# Patient Record
Sex: Female | Born: 1950
Health system: Southern US, Community
[De-identification: ages and names within clinical notes are randomized; demographics above are authoritative.]

## PROBLEM LIST (undated history)

## (undated) DIAGNOSIS — B019 Varicella without complication: Secondary | ICD-10-CM

## (undated) DIAGNOSIS — E785 Hyperlipidemia, unspecified: Secondary | ICD-10-CM

## (undated) DIAGNOSIS — Z860100 Personal history of colon polyps, unspecified: Secondary | ICD-10-CM

## (undated) DIAGNOSIS — I10 Essential (primary) hypertension: Secondary | ICD-10-CM

## (undated) HISTORY — PX: TUBAL LIGATION: SHX77

## (undated) HISTORY — PX: TOOTH EXTRACTION: SUR596

## (undated) HISTORY — DX: Essential (primary) hypertension: I10

## (undated) HISTORY — DX: Personal history of colonic polyps: Z86.010

## (undated) HISTORY — DX: Varicella without complication: B01.9

## (undated) HISTORY — DX: Hyperlipidemia, unspecified: E78.5

## (undated) HISTORY — DX: Personal history of colon polyps, unspecified: Z86.0100

---

## 2000-02-20 ENCOUNTER — Other Ambulatory Visit: Admission: RE | Admit: 2000-02-20 | Discharge: 2000-02-20 | Payer: Self-pay | Admitting: Gynecology

## 2002-06-30 ENCOUNTER — Other Ambulatory Visit: Admission: RE | Admit: 2002-06-30 | Discharge: 2002-06-30 | Payer: Self-pay | Admitting: Gynecology

## 2004-05-06 ENCOUNTER — Other Ambulatory Visit: Admission: RE | Admit: 2004-05-06 | Discharge: 2004-05-06 | Payer: Self-pay | Admitting: Gynecology

## 2014-09-14 ENCOUNTER — Other Ambulatory Visit: Payer: Self-pay | Admitting: *Deleted

## 2014-09-14 DIAGNOSIS — IMO0002 Reserved for concepts with insufficient information to code with codable children: Secondary | ICD-10-CM

## 2014-09-14 DIAGNOSIS — R229 Localized swelling, mass and lump, unspecified: Principal | ICD-10-CM

## 2014-09-22 ENCOUNTER — Ambulatory Visit
Admission: RE | Admit: 2014-09-22 | Discharge: 2014-09-22 | Disposition: A | Discharge status: Home / Self Care | Payer: BLUE CROSS/BLUE SHIELD | Source: Ambulatory Visit | Attending: *Deleted | Admitting: *Deleted

## 2014-09-22 DIAGNOSIS — IMO0002 Reserved for concepts with insufficient information to code with codable children: Secondary | ICD-10-CM

## 2014-09-22 DIAGNOSIS — R229 Localized swelling, mass and lump, unspecified: Principal | ICD-10-CM

## 2014-09-22 MED ORDER — IOPAMIDOL (ISOVUE-300) INJECTION 61%
75.0000 mL | Freq: Once | INTRAVENOUS | Status: AC | PRN
  Administered 2014-09-22: 75 mL via INTRAVENOUS

## 2017-08-12 ENCOUNTER — Encounter: Payer: Self-pay | Admitting: Gastroenterology

## 2017-10-15 ENCOUNTER — Ambulatory Visit (AMBULATORY_SURGERY_CENTER): Payer: Self-pay

## 2017-10-15 ENCOUNTER — Other Ambulatory Visit: Payer: Self-pay

## 2017-10-15 VITALS — Ht 67.0 in | Wt 152.8 lb

## 2017-10-15 DIAGNOSIS — Z1211 Encounter for screening for malignant neoplasm of colon: Secondary | ICD-10-CM

## 2017-10-15 MED ORDER — PEG-KCL-NACL-NASULF-NA ASC-C 140 G PO SOLR: 1.0000 | Freq: Once | ORAL | 0 refills | Status: AC

## 2017-10-15 NOTE — Progress Notes (Signed)
Denies allergies to eggs or soy products. Denies complication of anesthesia or sedation. Denies use of weight loss medication. Denies use of O2.   Emmi instructions declined.  

## 2017-10-19 ENCOUNTER — Encounter: Payer: Self-pay | Admitting: Gastroenterology

## 2017-10-29 ENCOUNTER — Ambulatory Visit (AMBULATORY_SURGERY_CENTER): Payer: BLUE CROSS/BLUE SHIELD | Admitting: Gastroenterology

## 2017-10-29 ENCOUNTER — Telehealth: Payer: Self-pay | Admitting: Gastroenterology

## 2017-10-29 ENCOUNTER — Encounter: Payer: Self-pay | Admitting: Gastroenterology

## 2017-10-29 VITALS — BP 119/55 | HR 60 | Temp 98.0°F | Resp 19 | Ht 67.0 in | Wt 152.0 lb

## 2017-10-29 DIAGNOSIS — K6389 Other specified diseases of intestine: Secondary | ICD-10-CM | POA: Diagnosis not present

## 2017-10-29 DIAGNOSIS — D123 Benign neoplasm of transverse colon: Secondary | ICD-10-CM

## 2017-10-29 DIAGNOSIS — Z1211 Encounter for screening for malignant neoplasm of colon: Secondary | ICD-10-CM

## 2017-10-29 DIAGNOSIS — R195 Other fecal abnormalities: Secondary | ICD-10-CM | POA: Diagnosis not present

## 2017-10-29 MED ORDER — SODIUM CHLORIDE 0.9 % IV SOLN: 500.0000 mL | Freq: Once | INTRAVENOUS | Status: DC

## 2017-10-29 NOTE — Op Note (Signed)
[facility] Patient Name: Carmen Cruz Procedure Date: 10/29/2017 9:53 AM MRN: [id number] Endoscopist: Mallie Mussel L. Loletha Carrow , MD Age: 67 Referring MD:  Date of Birth: 1950-05-14 Gender: Female Account #: [id number] Procedure:                Colonoscopy Indications:              Heme positive stool Medicines:                Monitored Anesthesia Care Procedure:                Pre-Anesthesia Assessment:                           - Prior to the procedure, a History and Physical                            was performed, and patient medications and                            allergies were reviewed. The patient's tolerance of                            previous anesthesia was also reviewed. The risks                            and benefits of the procedure and the sedation                            options and risks were discussed with the patient.                            All questions were answered, and informed consent                            was obtained. Prior Anticoagulants: The patient has                            taken no previous anticoagulant or antiplatelet                            agents. ASA Grade Assessment: II - A patient with                            mild systemic disease. After reviewing the risks                            and benefits, the patient was deemed in                            satisfactory condition to undergo the procedure.                           After obtaining informed consent, the colonoscope  was passed under direct vision. Throughout the                            procedure, the patient's blood pressure, pulse, and                            oxygen saturations were monitored continuously. The                            Colonoscope was introduced through the anus and                            advanced to the the cecum, identified by                            appendiceal orifice and ileocecal valve. The                         colonoscopy was performed without difficulty. The                            patient tolerated the procedure well. The quality                            of the bowel preparation was good. The ileocecal                            valve, appendiceal orifice, and rectum were                            photographed. The quality of the bowel preparation                            was evaluated using the BBPS [facility] Bowel                            Preparation Scale) with scores of: Right Colon = 3,                            Transverse Colon = 2 and Left Colon = 2. The total                            BBPS score equals 7. Scope In: 10:05:28 AM Scope Out: 10:22:56 AM Scope Withdrawal Time: 0 hours 10 minutes 43 seconds  Total Procedure Duration: 0 hours 17 minutes 28 seconds  Findings:                 The perianal and digital rectal examinations were                            normal.                           A 4 mm polyp was found in the transverse colon. The  polyp was sessile. The polyp was removed with a                            cold snare. Resection and retrieval were complete.                           Internal hemorrhoids were found during                            retroflexion. The hemorrhoids were large and Grade                            I (internal hemorrhoids that do not prolapse). One                            column was inflamed with an irregular mucosal                            appearance.                           The exam was otherwise without abnormality on                            direct and retroflexion views.                           A few diverticula were found in the left colon. Complications:            No immediate complications. Estimated Blood Loss:     Estimated blood loss was minimal. Impression:               - One 4 mm polyp in the transverse colon, removed                            with a cold snare.  Resected and retrieved.                           - Internal hemorrhoids.                           - The examination was otherwise normal on direct                            and retroflexion views.                           - Diverticulosis in the left colon. Recommendation:           - Patient has a contact number available for                            emergencies. The signs and symptoms of potential                            delayed complications were discussed  with the                            patient. Return to normal activities tomorrow.                            Written discharge instructions were provided to the                            patient.                           - Resume previous diet.                           - Continue present medications.                           - Await pathology results.                           - Repeat colonoscopy is recommended for                            surveillance. The colonoscopy date will be                            determined after pathology results from today's                            exam become available for review.                           - Refer to a colo-rectal surgeon at appointment to                            be scheduled for visual examination of internal                            hemorrhoid plexus and ensure no neoplasia. [name] L. Loletha Carrow, MD 10/29/2017 10:30:42 AM This report has been signed electronically.

## 2017-10-29 NOTE — Progress Notes (Signed)
Called to room to assist during endoscopic procedure.  Patient ID and intended procedure confirmed with present staff. Received instructions for my participation in the procedure from the performing physician.  

## 2017-10-29 NOTE — Patient Instructions (Signed)
Impression/Recommendations:  Polyp handout given to patient. Diverticulosis handout given to patient. Hemorrhoid handout given to patient.  Resume previous diet. Continue present medications.  Repeat colonoscopy recommended for surveillance.  Date to be determined after pathology results reviewed.  Refer to colo-rectal surgeon to schedule visual examination of internal hemorrhoid plexus and ensure no neoplasia.  YOU HAD AN ENDOSCOPIC PROCEDURE TODAY AT [facility]:   Refer to the procedure report that was given to you for any specific questions about what was found during the examination.  If the procedure report does not answer your questions, please call your gastroenterologist to clarify.  If you requested that your care partner not be given the details of your procedure findings, then the procedure report has been included in a sealed envelope for you to review at your convenience later.  YOU SHOULD EXPECT: Some feelings of bloating in the abdomen. Passage of more gas than usual.  Walking can help get rid of the air that was put into your GI tract during the procedure and reduce the bloating. If you had a lower endoscopy (such as a colonoscopy or flexible sigmoidoscopy) you may notice spotting of blood in your stool or on the toilet paper. If you underwent a bowel prep for your procedure, you may not have a normal bowel movement for a few days.  Please Note:  You might notice some irritation and congestion in your nose or some drainage.  This is from the oxygen used during your procedure.  There is no need for concern and it should clear up in a day or so.  SYMPTOMS TO REPORT IMMEDIATELY:   Following lower endoscopy (colonoscopy or flexible sigmoidoscopy):  Excessive amounts of blood in the stool  Significant tenderness or worsening of abdominal pains  Swelling of the abdomen that is new, acute  Fever of 100F or higher For urgent or emergent issues, a  gastroenterologist can be reached at any hour by calling [phone].   DIET:  We do recommend a small meal at first, but then you may proceed to your regular diet.  Drink plenty of fluids but you should avoid alcoholic beverages for 24 hours.  ACTIVITY:  You should plan to take it easy for the rest of today and you should NOT DRIVE or use heavy machinery until tomorrow (because of the sedation medicines used during the test).    FOLLOW UP: Our staff will call the number listed on your records the next business day following your procedure to check on you and address any questions or concerns that you may have regarding the information given to you following your procedure. If we do not reach you, we will leave a message.  However, if you are feeling well and you are not experiencing any problems, there is no need to return our call.  We will assume that you have returned to your regular daily activities without incident.  If any biopsies were taken you will be contacted by phone or by letter within the next 1-3 weeks.  Please call us at [phone] if you have not heard about the biopsies in 3 weeks.    SIGNATURES/CONFIDENTIALITY: You and/or your care partner have signed paperwork which will be entered into your electronic medical record.  These signatures attest to the fact that that the information above on your After Visit Summary has been reviewed and is understood.  Full responsibility of the confidentiality of this discharge information lies with you and/or your care-partner.

## 2017-10-29 NOTE — Progress Notes (Signed)
To PACU, VSS. Report to [email or web address] 

## 2017-10-29 NOTE — Telephone Encounter (Signed)
Please send referral to [facility] for this patient to see either Dr. Leighton Ruff or Dr. Annye English for evaluation of abnormal appearance internal hemorrhoidal tissue found on colonoscopy.

## 2017-10-30 NOTE — Telephone Encounter (Signed)
Sent and faxed referral to [facility] for consult for abnormal internal hemorrhoidal tissue.

## 2017-11-03 ENCOUNTER — Telehealth: Payer: Self-pay | Admitting: *Deleted

## 2017-11-03 NOTE — Telephone Encounter (Signed)
  Follow up Call-  Call back number 10/29/2017  Post procedure Call Back phone  # [phone]  Permission to leave phone message Yes  Some recent data might be hidden     Patient questions:  Do you have a fever, pain , or abdominal swelling? No. Pain Score  0 *  Have you tolerated food without any problems? Yes.    Have you been able to return to your normal activities? Yes.    Do you have any questions about your discharge instructions: Diet   No. Medications  No. Follow up visit  No.  Do you have questions or concerns about your Care? Yes.    Actions: * If pain score is 4 or above: No action needed, pain <4.

## 2017-11-04 ENCOUNTER — Encounter: Payer: Self-pay | Admitting: Gastroenterology

## 2017-12-22 ENCOUNTER — Telehealth: Payer: Self-pay

## 2017-12-22 NOTE — Telephone Encounter (Signed)
error 

## 2018-01-14 DIAGNOSIS — K641 Second degree hemorrhoids: Secondary | ICD-10-CM | POA: Diagnosis not present

## 2018-01-25 DIAGNOSIS — R69 Illness, unspecified: Secondary | ICD-10-CM | POA: Diagnosis not present

## 2018-01-27 ENCOUNTER — Ambulatory Visit: Payer: BLUE CROSS/BLUE SHIELD | Admitting: Family

## 2018-01-29 ENCOUNTER — Other Ambulatory Visit (INDEPENDENT_AMBULATORY_CARE_PROVIDER_SITE_OTHER): Payer: Medicare HMO

## 2018-01-29 ENCOUNTER — Encounter: Payer: Self-pay | Admitting: Family

## 2018-01-29 ENCOUNTER — Ambulatory Visit (INDEPENDENT_AMBULATORY_CARE_PROVIDER_SITE_OTHER): Payer: Medicare HMO | Admitting: Family

## 2018-01-29 VITALS — BP 118/72 | HR 70 | Temp 98.2°F | Wt 155.0 lb

## 2018-01-29 DIAGNOSIS — R739 Hyperglycemia, unspecified: Secondary | ICD-10-CM

## 2018-01-29 DIAGNOSIS — Z1231 Encounter for screening mammogram for malignant neoplasm of breast: Secondary | ICD-10-CM | POA: Diagnosis not present

## 2018-01-29 LAB — COMPREHENSIVE METABOLIC PANEL
ALT: 18 U/L (ref 0–35)
AST: 18 U/L (ref 0–37)
Albumin: 4.6 g/dL (ref 3.5–5.2)
Alkaline Phosphatase: 65 U/L (ref 39–117)
BUN: 10 mg/dL (ref 6–23)
CALCIUM: 10.5 mg/dL (ref 8.4–10.5)
CO2: 29 mEq/L (ref 19–32)
Chloride: 100 mEq/L (ref 96–112)
Creatinine, Ser: 0.46 mg/dL (ref 0.40–1.20)
GFR: 144.83 mL/min (ref >60.00)
Glucose, Bld: 107 mg/dL — ABNORMAL HIGH (ref 70–99)
POTASSIUM: 4.1 meq/L (ref 3.5–5.1)
SODIUM: 139 meq/L (ref 135–145)
Total Bilirubin: 0.9 mg/dL (ref 0.2–1.2)
Total Protein: 7.4 g/dL (ref 6.0–8.3)

## 2018-01-29 LAB — HEMOGLOBIN A1C: Hgb A1c MFr Bld: 5.8 % (ref 4.6–6.5)

## 2018-01-29 NOTE — Progress Notes (Signed)
Carmen Cruz is a 67 y.o. female with the following history as recorded in EpicCare:  There are no active problems to display for this patient.   Current Outpatient Medications  Medication Sig Dispense Refill  . Calcium-Magnesium-Vitamin D (CALCIUM 500 PO) Take 500 mg by mouth 2 (two) times daily.    . Multiple Vitamin (MULTIVITAMIN) tablet Take 1 tablet by mouth daily.    Marland Kitchen OVER THE COUNTER MEDICATION Vitamin D 3 one capsule daily.    Marland Kitchen OVER THE COUNTER MEDICATION Fish Oil, 1200 units three tablets daily.    Marland Kitchen OVER THE COUNTER MEDICATION Vitamin E 400 units one capsule daily.    Marland Kitchen OVER THE COUNTER MEDICATION Red Yeast Rice, two capsules once a day.    Marland Kitchen OVER THE COUNTER MEDICATION Vitamin A 25 mg one tablet daily.     No current facility-administered medications for this visit.     Allergies: Patient has no known allergies.  No past medical history on file.  Past Surgical History:  Procedure Laterality Date  . TOOTH EXTRACTION    . TUBAL LIGATION      Family History  Problem Relation Age of Onset  . Colon cancer Neg Hx   . Esophageal cancer Neg Hx   . Liver cancer Neg Hx   . Pancreatic cancer Neg Hx   . Rectal cancer Neg Hx   . Stomach cancer Neg Hx     Social History   Tobacco Use  . Smoking status: Former Research scientist (life sciences)  . Smokeless tobacco: Never Used  . Tobacco comment: Quit 8 years ago.  Substance Use Topics  . Alcohol use: Yes    Comment: socially    Subjective:  Patient presents today as a new patient; in baseline state of health with no complaints; up to date on primary care needs- had CPE done by previous provider in August right before retirement; has had pap smear, colonoscopy, flu shot done; sees eye doctor and dentist regularly; History of hyperglycemia- agrees to get labs updated today;   Defers pneumonia shots; will consider Shingrix at outpatient pharmacy;   Objective:  Vitals:   01/29/18 0927  BP: 118/72  Pulse: 70  Temp: 98.2 F (36.8 C)  Weight:  155 lb (70.3 kg)    General: Well developed, well nourished, in no acute distress  Skin : Warm and dry.  Head: Normocephalic and atraumatic  Eyes: Sclera and conjunctiva clear; pupils round and reactive to light; extraocular movements intact  Ears: External normal; canals clear; tympanic membranes normal  Oropharynx: Pink, supple. No suspicious lesions  Neck: Supple without thyromegaly, adenopathy  Lungs: Respirations unlabored; clear to auscultation bilaterally without wheeze, rales, rhonchi  CVS exam: normal rate and regular rhythm.  Abdomen: Soft; nontender; nondistended; normoactive bowel sounds; no masses or hepatosplenomegaly  Musculoskeletal: No deformities; no active joint inflammation  Extremities: No edema, cyanosis, clubbing  Vessels: Symmetric bilaterally  Neurologic: Alert and oriented; speech intact; face symmetrical; moves all extremities well; CNII-XII intact without focal deficit  Assessment:  1. Hyperglycemia   2. Visit for screening mammogram     Plan:  Will check CMP, Hgba1c today; follow-up in 6 months- 1 year; Mammogram order updated; Congratulated patient on commitment to her health;   No follow-ups on file.  Orders Placed This Encounter  Procedures  . MM Digital Screening    Standing Status:   Future    Standing Expiration Date:   04/01/2019    Scheduling Instructions:     Not due until  after mid-December    Order Specific Question:   Reason for Exam (SYMPTOM  OR DIAGNOSIS REQUIRED)    Answer:   screening mammogram    Order Specific Question:   Preferred imaging location?    Answer:   [facility]  . Comp Met (CMET)    Standing Status:   Future    Number of Occurrences:   1    Standing Expiration Date:   01/29/2019  . HgB A1c    Standing Status:   Future    Number of Occurrences:   1    Standing Expiration Date:   01/29/2019    Requested Prescriptions    No prescriptions requested or ordered in this encounter

## 2018-05-13 DIAGNOSIS — H16002 Unspecified corneal ulcer, left eye: Secondary | ICD-10-CM | POA: Diagnosis not present

## 2018-05-17 DIAGNOSIS — H16002 Unspecified corneal ulcer, left eye: Secondary | ICD-10-CM | POA: Diagnosis not present

## 2018-05-20 DIAGNOSIS — H16002 Unspecified corneal ulcer, left eye: Secondary | ICD-10-CM | POA: Diagnosis not present

## 2018-05-24 DIAGNOSIS — R69 Illness, unspecified: Secondary | ICD-10-CM | POA: Diagnosis not present

## 2018-11-29 DIAGNOSIS — R69 Illness, unspecified: Secondary | ICD-10-CM | POA: Diagnosis not present

## 2018-12-07 ENCOUNTER — Other Ambulatory Visit: Payer: Self-pay | Admitting: Family

## 2018-12-07 DIAGNOSIS — Z1231 Encounter for screening mammogram for malignant neoplasm of breast: Secondary | ICD-10-CM

## 2018-12-15 DIAGNOSIS — R69 Illness, unspecified: Secondary | ICD-10-CM | POA: Diagnosis not present

## 2018-12-23 DIAGNOSIS — H2513 Age-related nuclear cataract, bilateral: Secondary | ICD-10-CM | POA: Diagnosis not present

## 2018-12-23 DIAGNOSIS — H5203 Hypermetropia, bilateral: Secondary | ICD-10-CM | POA: Diagnosis not present

## 2019-01-21 ENCOUNTER — Other Ambulatory Visit: Payer: Self-pay

## 2019-01-21 ENCOUNTER — Ambulatory Visit
Admission: RE | Admit: 2019-01-21 | Discharge: 2019-01-21 | Disposition: A | Discharge status: Home / Self Care | Payer: Medicare HMO | Source: Ambulatory Visit | Attending: Family | Admitting: Family

## 2019-01-21 DIAGNOSIS — Z1231 Encounter for screening mammogram for malignant neoplasm of breast: Secondary | ICD-10-CM | POA: Diagnosis not present

## 2019-02-18 ENCOUNTER — Other Ambulatory Visit (INDEPENDENT_AMBULATORY_CARE_PROVIDER_SITE_OTHER): Payer: Medicare HMO

## 2019-02-18 ENCOUNTER — Ambulatory Visit (INDEPENDENT_AMBULATORY_CARE_PROVIDER_SITE_OTHER): Payer: Medicare HMO | Admitting: Family

## 2019-02-18 ENCOUNTER — Encounter: Payer: Self-pay | Admitting: Family

## 2019-02-18 ENCOUNTER — Other Ambulatory Visit: Payer: Self-pay

## 2019-02-18 VITALS — BP 116/78 | HR 62 | Temp 98.5°F | Ht 67.0 in | Wt 155.8 lb

## 2019-02-18 DIAGNOSIS — R7303 Prediabetes: Secondary | ICD-10-CM

## 2019-02-18 DIAGNOSIS — Z13220 Encounter for screening for lipoid disorders: Secondary | ICD-10-CM

## 2019-02-18 DIAGNOSIS — Z0000 Encounter for general adult medical examination without abnormal findings: Secondary | ICD-10-CM

## 2019-02-18 DIAGNOSIS — Z1321 Encounter for screening for nutritional disorder: Secondary | ICD-10-CM

## 2019-02-18 LAB — CBC WITH DIFFERENTIAL/PLATELET
Basophils Absolute: 0 10*3/uL (ref 0.0–0.1)
Basophils Relative: 0.8 % (ref 0.0–3.0)
Eosinophils Absolute: 0.1 10*3/uL (ref 0.0–0.7)
Eosinophils Relative: 2 % (ref 0.0–5.0)
HCT: 42.7 % (ref 36.0–46.0)
Hemoglobin: 14.7 g/dL (ref 12.0–15.0)
Lymphocytes Relative: 40 % (ref 12.0–46.0)
Lymphs Abs: 1.9 10*3/uL (ref 0.7–4.0)
MCHC: 34.3 g/dL (ref 30.0–36.0)
MCV: 95 fl (ref 78.0–100.0)
Monocytes Absolute: 0.6 10*3/uL (ref 0.1–1.0)
Monocytes Relative: 13 % — ABNORMAL HIGH (ref 3.0–12.0)
Neutro Abs: 2.1 10*3/uL (ref 1.4–7.7)
Neutrophils Relative %: 44.2 % (ref 43.0–77.0)
Platelets: 339 10*3/uL (ref 150.0–400.0)
RBC: 4.49 Mil/uL (ref 3.87–5.11)
RDW: 12.6 % (ref 11.5–15.5)
WBC: 4.7 10*3/uL (ref 4.0–10.5)

## 2019-02-18 LAB — VITAMIN D 25 HYDROXY (VIT D DEFICIENCY, FRACTURES): VITD: 55.6 ng/mL (ref 30.00–100.00)

## 2019-02-18 LAB — LIPID PANEL
Cholesterol: 258 mg/dL — ABNORMAL HIGH (ref 0–200)
HDL: 58.7 mg/dL (ref >39.00)
LDL Cholesterol: 161 mg/dL — ABNORMAL HIGH (ref 0–99)
NonHDL: 199.01
Total CHOL/HDL Ratio: 4
Triglycerides: 190 mg/dL — ABNORMAL HIGH (ref 0.0–149.0)
VLDL: 38 mg/dL (ref 0.0–40.0)

## 2019-02-18 LAB — COMPREHENSIVE METABOLIC PANEL
ALT: 19 U/L (ref 0–35)
AST: 21 U/L (ref 0–37)
Albumin: 4.4 g/dL (ref 3.5–5.2)
Alkaline Phosphatase: 64 U/L (ref 39–117)
BUN: 11 mg/dL (ref 6–23)
CO2: 28 mEq/L (ref 19–32)
Calcium: 9.1 mg/dL (ref 8.4–10.5)
Chloride: 103 mEq/L (ref 96–112)
Creatinine, Ser: 0.5 mg/dL (ref 0.40–1.20)
GFR: 123.37 mL/min (ref >60.00)
Glucose, Bld: 112 mg/dL — ABNORMAL HIGH (ref 70–99)
Potassium: 4.2 mEq/L (ref 3.5–5.1)
Sodium: 140 mEq/L (ref 135–145)
Total Bilirubin: 0.9 mg/dL (ref 0.2–1.2)
Total Protein: 6.9 g/dL (ref 6.0–8.3)

## 2019-02-18 LAB — HEMOGLOBIN A1C: Hgb A1c MFr Bld: 5.6 % (ref 4.6–6.5)

## 2019-02-18 NOTE — Progress Notes (Signed)
Carmen Cruz is a 68 y.o. female with the following history as recorded in EpicCare:  There are no active problems to display for this patient.   Current Outpatient Medications  Medication Sig Dispense Refill  . Calcium-Magnesium-Vitamin D (CALCIUM 500 PO) Take 500 mg by mouth 2 (two) times daily.    . Multiple Vitamin (MULTIVITAMIN) tablet Take 1 tablet by mouth daily.    Marland Kitchen OVER THE COUNTER MEDICATION Vitamin D 3 one capsule daily.    Marland Kitchen OVER THE COUNTER MEDICATION Fish Oil, 1200 units three tablets daily.    Marland Kitchen OVER THE COUNTER MEDICATION Vitamin E 400 units one capsule daily.    Marland Kitchen OVER THE COUNTER MEDICATION Red Yeast Rice, two capsules once a day.    . zinc gluconate 50 MG tablet Take 50 mg by mouth daily.    Marland Kitchen OVER THE COUNTER MEDICATION Vitamin A 25 mg one tablet daily.     No current facility-administered medications for this visit.     Allergies: Patient has no known allergies.  Past Medical History:  Diagnosis Date  . Chicken pox   . History of colon polyps     Past Surgical History:  Procedure Laterality Date  . TOOTH EXTRACTION    . TUBAL LIGATION      Family History  Problem Relation Age of Onset  . High Cholesterol Mother   . High blood pressure Mother   . Stroke Mother   . High Cholesterol Father   . High blood pressure Father   . Colon cancer Neg Hx   . Esophageal cancer Neg Hx   . Liver cancer Neg Hx   . Pancreatic cancer Neg Hx   . Rectal cancer Neg Hx   . Stomach cancer Neg Hx   . Breast cancer Neg Hx     Social History   Tobacco Use  . Smoking status: Former Research scientist (life sciences)  . Smokeless tobacco: Never Used  . Tobacco comment: Quit 8 years ago.  Substance Use Topics  . Alcohol use: Yes    Comment: socially    Subjective:  Here for medicare wellness, no new complaints. Please see A/P for status and treatment of chronic medical problems.   Diet: heart healthy or DM if diabetic Physical activity: sedentary Depression/mood screen: negative Hearing:  intact to whispered voice Visual acuity: grossly normal, performs annual eye exam  ADLs: capable Fall risk: none Home safety: good Cognitive evaluation: intact to orientation, naming, recall and repetition EOL planning: adv directives discussed    Office Visit from 02/18/2019 in [facility]  PHQ-2 Total Score  0        I have personally reviewed and have noted 1. The patient's medical and social history - reviewed today no changes 2. Their use of alcohol, tobacco or illicit drugs 3. Their current medications and supplements 4. The patient's functional ability including ADL's, fall risks, home safety risks and hearing or visual impairment. 5. Diet and physical activities 6. Evidence for depression or mood disorders 7. Care team reviewed and updated  Patient Care Team: Marrian Salvage, FNP as PCP - General (Internal Medicine) Past Medical History:  Diagnosis Date  . Chicken pox   . History of colon polyps    Past Surgical History:  Procedure Laterality Date  . TOOTH EXTRACTION    . TUBAL LIGATION     Family History  Problem Relation Age of Onset  . High Cholesterol Mother   . High blood pressure Mother   .  Stroke Mother   . High Cholesterol Father   . High blood pressure Father   . Colon cancer Neg Hx   . Esophageal cancer Neg Hx   . Liver cancer Neg Hx   . Pancreatic cancer Neg Hx   . Rectal cancer Neg Hx   . Stomach cancer Neg Hx   . Breast cancer Neg Hx      Health Maintenance  Topic Date Due  . Hepatitis C Screening  1950-11-17  . DEXA SCAN  02/18/2020 (Originally 12/05/2017)  . PNA vac Low Risk Adult (2 of 2 - PPSV23) 12/15/2019  . MAMMOGRAM  01/20/2021  . COLONOSCOPY  10/30/2027  . TETANUS/TDAP  11/20/2027  . INFLUENZA VACCINE  Completed      Objective:  Vitals:   02/18/19 0927  BP: 116/78  Pulse: 62  Temp: 98.5 F (36.9 C)  TempSrc: Oral  SpO2: 97%  Weight: 155 lb 12.8 oz (70.7 kg)  Height: '5\' 7"'  (1.702 m)     General: Well developed, well nourished, in no acute distress  Skin : Warm and dry.  Head: Normocephalic and atraumatic  Eyes: Sclera and conjunctiva clear; pupils round and reactive to light; extraocular movements intact  Ears: External normal; canals clear; tympanic membranes normal  Oropharynx: Pink, supple. No suspicious lesions  Neck: Supple without thyromegaly, adenopathy  Lungs: Respirations unlabored; clear to auscultation bilaterally without wheeze, rales, rhonchi  CVS exam: normal rate and regular rhythm.  Abdomen: Soft; nontender; nondistended; normoactive bowel sounds; no masses or hepatosplenomegaly  Musculoskeletal: No deformities; no active joint inflammation  Extremities: No edema, cyanosis, clubbing  Vessels: Symmetric bilaterally  Neurologic: Alert and oriented; speech intact; face symmetrical; moves all extremities well; CNII-XII intact without focal deficit   Assessment:  1. PE (physical exam), annual   2. Lipid screening   3. Pre-diabetes   4. Encounter for vitamin deficiency screening     Plan:  Age appropriate preventive healthcare needs addressed; encouraged regular eye doctor and dental exams; encouraged regular exercise; will update labs and refills as needed today; follow-up to be determined; Patient defers DEXA exam; flu and Prevnar updated;    No follow-ups on file.  Orders Placed This Encounter  Procedures  . CBC w/Diff    Standing Status:   Future    Standing Expiration Date:   02/18/2020  . Comp Met (CMET)    Standing Status:   Future    Standing Expiration Date:   02/18/2020  . Lipid panel    Standing Status:   Future    Standing Expiration Date:   02/18/2020  . HgB A1c    Standing Status:   Future    Standing Expiration Date:   02/18/2020  . Vitamin D (25 hydroxy)    Standing Status:   Future    Standing Expiration Date:   02/18/2020    Requested Prescriptions    No prescriptions requested or ordered in this encounter

## 2019-06-06 DIAGNOSIS — R69 Illness, unspecified: Secondary | ICD-10-CM | POA: Diagnosis not present

## 2019-12-13 DIAGNOSIS — R69 Illness, unspecified: Secondary | ICD-10-CM | POA: Diagnosis not present

## 2019-12-14 ENCOUNTER — Other Ambulatory Visit: Payer: Self-pay | Admitting: Family

## 2019-12-14 DIAGNOSIS — Z1231 Encounter for screening mammogram for malignant neoplasm of breast: Secondary | ICD-10-CM

## 2020-01-04 DIAGNOSIS — R69 Illness, unspecified: Secondary | ICD-10-CM | POA: Diagnosis not present

## 2020-01-24 ENCOUNTER — Ambulatory Visit
Admission: RE | Admit: 2020-01-24 | Discharge: 2020-01-24 | Disposition: A | Discharge status: Home / Self Care | Payer: Medicare HMO | Source: Ambulatory Visit | Attending: Family | Admitting: Family

## 2020-01-24 ENCOUNTER — Other Ambulatory Visit: Payer: Self-pay

## 2020-01-24 DIAGNOSIS — Z1231 Encounter for screening mammogram for malignant neoplasm of breast: Secondary | ICD-10-CM

## 2020-02-20 ENCOUNTER — Encounter: Payer: Self-pay | Admitting: Family

## 2020-02-20 ENCOUNTER — Ambulatory Visit (INDEPENDENT_AMBULATORY_CARE_PROVIDER_SITE_OTHER): Payer: Medicare HMO | Admitting: Family

## 2020-02-20 ENCOUNTER — Other Ambulatory Visit: Payer: Self-pay

## 2020-02-20 VITALS — BP 200/100 | HR 57 | Temp 98.4°F | Ht 67.0 in | Wt 154.0 lb

## 2020-02-20 DIAGNOSIS — Z13220 Encounter for screening for lipoid disorders: Secondary | ICD-10-CM

## 2020-02-20 DIAGNOSIS — Z0000 Encounter for general adult medical examination without abnormal findings: Secondary | ICD-10-CM | POA: Diagnosis not present

## 2020-02-20 DIAGNOSIS — R9431 Abnormal electrocardiogram [ECG] [EKG]: Secondary | ICD-10-CM | POA: Diagnosis not present

## 2020-02-20 DIAGNOSIS — I10 Essential (primary) hypertension: Secondary | ICD-10-CM | POA: Diagnosis not present

## 2020-02-20 LAB — LIPID PANEL
Cholesterol: 267 mg/dL — ABNORMAL HIGH (ref 0–200)
HDL: 63.6 mg/dL (ref >39.00)
NonHDL: 203.58
Total CHOL/HDL Ratio: 4
Triglycerides: 239 mg/dL — ABNORMAL HIGH (ref 0.0–149.0)
VLDL: 47.8 mg/dL — ABNORMAL HIGH (ref 0.0–40.0)

## 2020-02-20 LAB — CBC WITH DIFFERENTIAL/PLATELET
Basophils Absolute: 0 10*3/uL (ref 0.0–0.1)
Basophils Relative: 0.6 % (ref 0.0–3.0)
Eosinophils Absolute: 0.1 10*3/uL (ref 0.0–0.7)
Eosinophils Relative: 1.1 % (ref 0.0–5.0)
HCT: 46.1 % — ABNORMAL HIGH (ref 36.0–46.0)
Hemoglobin: 15.8 g/dL — ABNORMAL HIGH (ref 12.0–15.0)
Lymphocytes Relative: 35.1 % (ref 12.0–46.0)
Lymphs Abs: 2 10*3/uL (ref 0.7–4.0)
MCHC: 34.3 g/dL (ref 30.0–36.0)
MCV: 94.6 fl (ref 78.0–100.0)
Monocytes Absolute: 0.5 10*3/uL (ref 0.1–1.0)
Monocytes Relative: 8.5 % (ref 3.0–12.0)
Neutro Abs: 3.2 10*3/uL (ref 1.4–7.7)
Neutrophils Relative %: 54.7 % (ref 43.0–77.0)
Platelets: 337 10*3/uL (ref 150.0–400.0)
RBC: 4.87 Mil/uL (ref 3.87–5.11)
RDW: 12.4 % (ref 11.5–15.5)
WBC: 5.8 10*3/uL (ref 4.0–10.5)

## 2020-02-20 LAB — COMPREHENSIVE METABOLIC PANEL
ALT: 18 U/L (ref 0–35)
AST: 18 U/L (ref 0–37)
Albumin: 4.5 g/dL (ref 3.5–5.2)
Alkaline Phosphatase: 57 U/L (ref 39–117)
BUN: 13 mg/dL (ref 6–23)
CO2: 31 mEq/L (ref 19–32)
Calcium: 9.8 mg/dL (ref 8.4–10.5)
Chloride: 102 mEq/L (ref 96–112)
Creatinine, Ser: 0.52 mg/dL (ref 0.40–1.20)
GFR: 96.28 mL/min (ref >60.00)
Glucose, Bld: 101 mg/dL — ABNORMAL HIGH (ref 70–99)
Potassium: 4 mEq/L (ref 3.5–5.1)
Sodium: 139 mEq/L (ref 135–145)
Total Bilirubin: 0.9 mg/dL (ref 0.2–1.2)
Total Protein: 7.2 g/dL (ref 6.0–8.3)

## 2020-02-20 LAB — LDL CHOLESTEROL, DIRECT: Direct LDL: 179 mg/dL

## 2020-02-20 LAB — TSH: TSH: 0.69 u[IU]/mL (ref 0.35–4.50)

## 2020-02-20 MED ORDER — AMLODIPINE BESYLATE 5 MG PO TABS: 5.0000 mg | ORAL_TABLET | Freq: Every day | ORAL | 0 refills | Status: DC

## 2020-02-20 NOTE — Progress Notes (Signed)
Carmen Cruz is a 69 y.o. female with the following history as recorded in EpicCare:  There are no problems to display for this patient.   Current Outpatient Medications  Medication Sig Dispense Refill  . APPLE CIDER VINEGAR PO Take 450 mg by mouth.    . Calcium-Magnesium-Vitamin D (CALCIUM 500 PO) Take 500 mg by mouth 2 (two) times daily.    . Coenzyme Q10 (COQ10) 100 MG CAPS Take by mouth.    . Multiple Vitamin (MULTIVITAMIN) tablet Take 1 tablet by mouth daily.    . NON FORMULARY Fat Burner Green Tea    . OVER THE COUNTER MEDICATION Vitamin D 3 one capsule daily.    Marland Kitchen OVER THE COUNTER MEDICATION Fish Oil, 1200 units three tablets daily.    Marland Kitchen OVER THE COUNTER MEDICATION Vitamin E 400 units one capsule daily.    Marland Kitchen OVER THE COUNTER MEDICATION Red Yeast Rice, two capsules once a day.    . zinc gluconate 50 MG tablet Take 50 mg by mouth daily.    Marland Kitchen amLODipine (NORVASC) 5 MG tablet Take 1 tablet (5 mg total) by mouth daily. 30 tablet 0   No current facility-administered medications for this visit.    Allergies: Patient has no known allergies.  Past Medical History:  Diagnosis Date  . Chicken pox   . History of colon polyps     Past Surgical History:  Procedure Laterality Date  . TOOTH EXTRACTION    . TUBAL LIGATION      Family History  Problem Relation Age of Onset  . High Cholesterol Mother   . High blood pressure Mother   . Stroke Mother   . High Cholesterol Father   . High blood pressure Father   . Colon cancer Neg Hx   . Esophageal cancer Neg Hx   . Liver cancer Neg Hx   . Pancreatic cancer Neg Hx   . Rectal cancer Neg Hx   . Stomach cancer Neg Hx   . Breast cancer Neg Hx     Social History   Tobacco Use  . Smoking status: Former Research scientist (life sciences)  . Smokeless tobacco: Never Used  . Tobacco comment: Quit 8 years ago.  Substance Use Topics  . Alcohol use: Yes    Comment: socially    Subjective:  Present for yearly CPE; in baseline state of health today- is very  surprised to see how elevated her blood pressure is today; Denies any chest pain, shortness of breath, blurred vision or headache. Notes that both of her parents did have hypertension; she does not check her blood pressure regularly; she is suspicious that her pressure is elevated today because she took her vitamins on an empty stomach;   Review of Systems  Constitutional: Negative.   HENT: Negative.   Eyes: Negative.   Respiratory: Negative.   Cardiovascular: Negative.   Gastrointestinal: Negative.   Genitourinary: Negative.   Musculoskeletal: Negative.   Skin: Negative.   Neurological: Negative.   Endo/Heme/Allergies: Negative.   Psychiatric/Behavioral: Negative.       Objective:  Vitals:   02/20/20 0852  BP: (!) 200/100  Pulse: (!) 57  Temp: 98.4 F (36.9 C)  TempSrc: Oral  SpO2: 98%  Weight: 154 lb (69.9 kg)  Height: '5\' 7"'  (1.702 m)    General: Well developed, well nourished, in no acute distress  Skin : Warm and dry.  Head: Normocephalic and atraumatic  Eyes: Sclera and conjunctiva clear; pupils round and reactive to light; extraocular movements intact  Ears: External normal; canals clear; tympanic membranes normal  Oropharynx: Pink, supple. No suspicious lesions  Neck: Supple without thyromegaly, adenopathy  Lungs: Respirations unlabored; clear to auscultation bilaterally without wheeze, rales, rhonchi  CVS exam: normal rate and regular rhythm.  Abdomen: Soft; nontender; nondistended; normoactive bowel sounds; no masses or hepatosplenomegaly  Musculoskeletal: No deformities; no active joint inflammation  Extremities: No edema, cyanosis, clubbing  Vessels: Symmetric bilaterally  Neurologic: Alert and oriented; speech intact; face symmetrical; moves all extremities well; CNII-XII intact without focal deficit   Assessment:  1. PE (physical exam), annual   2. Lipid screening   3. Hypertension, unspecified type   4. Abnormal EKG     Plan:  Age appropriate  preventive healthcare needs addressed; encouraged regular eye doctor and dental exams; encouraged regular exercise; will update labs and refills as needed today; follow-up to be determined; Mammogram is scheduled for tomorrow; COVID vaccine and booster is up to date but she does not have card with her today;   Blood pressure was checked by 3 different people with different cuffs and similar readings were found; EKG shows bradycardia/ LBBB; patient agrees to short term trial of medication and follow-up with cardiology; Rx for Amlodipine 5 mg daily; follow-up to be determined;  This visit occurred during the SARS-CoV-2 public health emergency.  Safety protocols were in place, including screening questions prior to the visit, additional usage of staff PPE, and extensive cleaning of exam room while observing appropriate contact time as indicated for disinfecting solutions.     No follow-ups on file.  Orders Placed This Encounter  Procedures  . CBC with Differential/Platelet    Standing Status:   Future    Number of Occurrences:   1    Standing Expiration Date:   02/19/2021  . Comp Met (CMET)    Standing Status:   Future    Number of Occurrences:   1    Standing Expiration Date:   02/19/2021  . Lipid panel    Standing Status:   Future    Number of Occurrences:   1    Standing Expiration Date:   02/19/2021  . TSH    Standing Status:   Future    Number of Occurrences:   1    Standing Expiration Date:   02/19/2021  . LDL cholesterol, direct  . Ambulatory referral to Cardiology    Referral Priority:   Routine    Referral Type:   Consultation    Referral Reason:   Specialty Services Required    Requested Specialty:   Cardiology    Number of Visits Requested:   1  . EKG 12-Lead    Requested Prescriptions   Signed Prescriptions Disp Refills  . amLODipine (NORVASC) 5 MG tablet 30 tablet 0    Sig: Take 1 tablet (5 mg total) by mouth daily.

## 2020-02-21 ENCOUNTER — Ambulatory Visit (INDEPENDENT_AMBULATORY_CARE_PROVIDER_SITE_OTHER): Payer: Medicare HMO

## 2020-02-21 ENCOUNTER — Ambulatory Visit
Admission: RE | Admit: 2020-02-21 | Discharge: 2020-02-21 | Disposition: A | Discharge status: Home / Self Care | Payer: Medicare HMO | Source: Ambulatory Visit | Attending: Family | Admitting: Family

## 2020-02-21 ENCOUNTER — Telehealth: Payer: Self-pay

## 2020-02-21 DIAGNOSIS — Z1231 Encounter for screening mammogram for malignant neoplasm of breast: Secondary | ICD-10-CM | POA: Diagnosis not present

## 2020-02-21 DIAGNOSIS — Z0000 Encounter for general adult medical examination without abnormal findings: Secondary | ICD-10-CM

## 2020-02-21 NOTE — Progress Notes (Addendum)
I connected with Carmen Cruz today by telephone and verified that I am speaking with the correct person using two identifiers. Location patient: home Location provider: work Persons participating in the virtual visit: Noele Icenhour, husband and Lisette Abu, LPN.   I discussed the limitations, risks, security and privacy concerns of performing an evaluation and management service by telephone and the availability of in person appointments. I also discussed with the patient that there may be a patient responsible charge related to this service. The patient expressed understanding and verbally consented to this telephonic visit.    Interactive audio and video telecommunications were attempted between this provider and patient, however failed, due to patient having technical difficulties OR patient did not have access to video capability.  We continued and completed visit with audio only.  Some vital signs may be absent or patient reported.   Time Spent with patient on telephone encounter: 30 minutes  Subjective:   Carmen Cruz is a 69 y.o. female who presents for Medicare Annual (Subsequent) preventive examination.  Review of Systems    No ROS. Medicare Wellness Visit. Additional risk factors are reflected in social history. Cardiac Risk Factors include: advanced age (>79men, >67 women);family history of premature cardiovascular disease;hypertension Sleep Patterns: No sleep issues, feels rested on waking and sleeps 7-8 hours nightly. Home Safety/Smoke Alarms: Feels safe in home; uses home alarm. Smoke alarms in place. Living environment: 2-story home; Lives with spouse; no needs for DME; good support system. Seat Belt Safety/Bike Helmet: Wears seat belt.    Objective:    There were no vitals filed for this visit. There is no height or weight on file to calculate BMI.  Advanced Directives 02/21/2020  Does Patient Have a Medical Advance Directive? Yes  Type of Engineer, materials of [place];Living will  Does patient want to make changes to medical advance directive? No - Patient declined  Copy of [place] in Chart? No - copy requested    Current Medications (verified) Outpatient Encounter Medications as of 02/21/2020  Medication Sig  . amLODipine (NORVASC) 5 MG tablet Take 1 tablet (5 mg total) by mouth daily.  . APPLE CIDER VINEGAR PO Take 450 mg by mouth.  . Calcium-Magnesium-Vitamin D (CALCIUM 500 PO) Take 500 mg by mouth 2 (two) times daily.  . Coenzyme Q10 (COQ10) 100 MG CAPS Take by mouth.  . Multiple Vitamin (MULTIVITAMIN) tablet Take 1 tablet by mouth daily.  . NON FORMULARY Fat Burner Green Tea  . OVER THE COUNTER MEDICATION Vitamin D 3 one capsule daily.  Marland Kitchen OVER THE COUNTER MEDICATION Fish Oil, 1200 units three tablets daily.  Marland Kitchen OVER THE COUNTER MEDICATION Vitamin E 400 units one capsule daily.  Marland Kitchen OVER THE COUNTER MEDICATION Red Yeast Rice, two capsules once a day.  . zinc gluconate 50 MG tablet Take 50 mg by mouth daily.   No facility-administered encounter medications on file as of 02/21/2020.    Allergies (verified) Patient has no known allergies.   History: Past Medical History:  Diagnosis Date  . Chicken pox   . History of colon polyps    Past Surgical History:  Procedure Laterality Date  . TOOTH EXTRACTION    . TUBAL LIGATION     Family History  Problem Relation Age of Onset  . High Cholesterol Mother   . High blood pressure Mother   . Stroke Mother   . High Cholesterol Father   . High blood pressure Father   .  Breast cancer Paternal Aunt   . Colon cancer Neg Hx   . Esophageal cancer Neg Hx   . Liver cancer Neg Hx   . Pancreatic cancer Neg Hx   . Rectal cancer Neg Hx   . Stomach cancer Neg Hx    Social History   Socioeconomic History  . Marital status: Married    Spouse name: Not on file  . Number of children: Not on file  . Years of education: Not on file  . Highest  education level: Not on file  Occupational History  . Not on file  Tobacco Use  . Smoking status: Former Research scientist (life sciences)  . Smokeless tobacco: Never Used  . Tobacco comment: Quit 8 years ago.  Vaping Use  . Vaping Use: Never used  Substance and Sexual Activity  . Alcohol use: Yes    Comment: socially  . Drug use: Never  . Sexual activity: Not on file  Other Topics Concern  . Not on file  Social History Narrative  . Not on file   Social Determinants of Health   Financial Resource Strain: Low Risk   . Difficulty of Paying Living Expenses: Not hard at all  Food Insecurity: No Food Insecurity  . Worried About Charity fundraiser in the Last Year: Never true  . Ran Out of Food in the Last Year: Never true  Transportation Needs: No Transportation Needs  . Lack of Transportation (Medical): No  . Lack of Transportation (Non-Medical): No  Physical Activity: Sufficiently Active  . Days of Exercise per Week: 5 days  . Minutes of Exercise per Session: 30 min  Stress: No Stress Concern Present  . Feeling of Stress : Not at all  Social Connections: Socially Integrated  . Frequency of Communication with Friends and Family: More than three times a week  . Frequency of Social Gatherings with Friends and Family: More than three times a week  . Attends Religious Services: More than 4 times per year  . Active Member of Clubs or Organizations: Yes  . Attends Archivist Meetings: More than 4 times per year  . Marital Status: Married    Tobacco Counseling Counseling given: Not Answered Comment: Quit 8 years ago.   Clinical Intake:  Pre-visit preparation completed: Yes  Pain : No/denies pain     Nutritional Risks: None Diabetes: No  How often do you need to have someone help you when you read instructions, pamphlets, or other written materials from your doctor or pharmacy?: 1 - Never What is the last grade level you completed in school?: HSG  Diabetic? no  Interpreter  Needed?: No  Information entered by :: Lisette Abu, LPN   Activities of Daily Living In your present state of health, do you have any difficulty performing the following activities: 02/21/2020 02/20/2020  Hearing? N N  Vision? N Y  Difficulty concentrating or making decisions? N N  Walking or climbing stairs? N N  Dressing or bathing? N N  Doing errands, shopping? N N  Preparing Food and eating ? N -  Using the Toilet? N -  In the past six months, have you accidently leaked urine? N -  Do you have problems with loss of bowel control? N -  Managing your Medications? N -  Managing your Finances? N -  Housekeeping or managing your Housekeeping? N -  Some recent data might be hidden    Patient Care Team: Marrian Salvage, [place] as PCP - General (Internal Medicine) Renaldo Fiddler,  Saddie Benders, OD as Consulting Physician (Optometry)  Indicate any recent Medical Services you may have received from other than [facility] providers in the past year (date may be approximate).     Assessment:   This is a routine wellness examination for Carmen Cruz.  Hearing/Vision screen No exam data present  Dietary issues and exercise activities discussed: Current Exercise Habits: Home exercise routine, Type of exercise: walking, Time (Minutes): 30, Frequency (Times/Week): 5, Weekly Exercise (Minutes/Week): 150, Intensity: Moderate, Exercise limited by: None identified  Goals    .  Patient Stated (pt-stated)      To maintain my current health status by continuing to eat healthy, stay physically active and socially active.      Depression Screen PHQ 2/9 Scores 02/21/2020 02/20/2020 02/18/2019 01/29/2018  PHQ - 2 Score 0 0 0 0    Fall Risk Fall Risk  02/21/2020 02/20/2020 01/29/2018  Falls in the past year? 1 1 No  Number falls in past yr: 1 1 -  Injury with Fall? 0 0 -  Follow up Falls evaluation completed - -    Any stairs in or around the home? Yes  If so, are there any without handrails? No  Home  free of loose throw rugs in walkways, pet beds, electrical cords, etc? Yes  Adequate lighting in your home to reduce risk of falls? Yes   ASSISTIVE DEVICES UTILIZED TO PREVENT FALLS:  Life alert? No  Use of a cane, walker or w/c? No  Grab bars in the bathroom? No  Shower chair or bench in shower? Yes  Elevated toilet seat or a handicapped toilet? Yes   TIMED UP AND GO:  Was the test performed? No .  Length of time to ambulate 10 feet: 0  sec.   Gait steady and fast without use of assistive device  Cognitive Function: Patient is cogitatively intact.        Immunizations Immunization History  Administered Date(s) Administered  . Influenza, High Dose Seasonal PF 12/15/2018  . Influenza, Quadrivalent, Recombinant, Inj, Pf 01/25/2018  . Influenza-Unspecified 01/06/2020  . Pneumococcal Conjugate-13 12/15/2018    TDAP status: Up to date Flu Vaccine status: Up to date Pneumococcal vaccine status: Up to date Covid-19 vaccine status: Completed vaccines  Qualifies for Shingles Vaccine? Yes   Zostavax completed No   Shingrix Completed?: No.    Education has been provided regarding the importance of this vaccine. Patient has been advised to call insurance company to determine out of pocket expense if they have not yet received this vaccine. Advised may also receive vaccine at local pharmacy or Health Dept. Verbalized acceptance and understanding.  Screening Tests Health Maintenance  Topic Date Due  . Hepatitis C Screening  Never done  . COVID-19 Vaccine (1) Never done  . PNA vac Low Risk Adult (2 of 2 - PPSV23) 12/15/2019  . DEXA SCAN  02/19/2021 (Originally 12/05/2017)  . MAMMOGRAM  01/20/2021  . COLONOSCOPY  10/30/2027  . TETANUS/TDAP  11/20/2027  . INFLUENZA VACCINE  Completed    Health Maintenance  Health Maintenance Due  Topic Date Due  . Hepatitis C Screening  Never done  . COVID-19 Vaccine (1) Never done  . PNA vac Low Risk Adult (2 of 2 - PPSV23) 12/15/2019     Colorectal cancer screening: Completed 10/29/2017. Repeat every 10 years Mammogram status: Completed 02/21/2020. Repeat every year  Bone density status: never ordered  Lung Cancer Screening: (Low Dose CT Chest recommended if Age 51-80 years, 30 pack-year currently smoking  OR have quit w/in 15years.) does not qualify.   Lung Cancer Screening Referral: no  Additional Screening:  Hepatitis C Screening: does qualify; Completed: yes  Vision Screening: Recommended annual ophthalmology exams for early detection of glaucoma and other disorders of the eye. Is the patient up to date with their annual eye exam?  Yes  Who is the provider or what is the name of the office in which the patient attends annual eye exams? [place], OD. If pt is not established with a provider, would they like to be referred to a provider to establish care? No .   Dental Screening: Recommended annual dental exams for proper oral hygiene  Community Resource Referral / Chronic Care Management: CRR required this visit?  No   CCM required this visit?  No      Plan:     I have personally reviewed and noted the following in the patient's chart:   . Medical and social history . Use of alcohol, tobacco or illicit drugs  . Current medications and supplements . Functional ability and status . Nutritional status . Physical activity . Advanced directives . List of other physicians . Hospitalizations, surgeries, and ER visits in previous 12 months . Vitals . Screenings to include cognitive, depression, and falls . Referrals and appointments  In addition, I have reviewed and discussed with patient certain preventive protocols, quality metrics, and best practice recommendations. A written personalized care plan for preventive services as well as general preventive health recommendations were provided to patient.     Sheral Flow, LPN   12/82/0813   Nurse Notes:  Patient is cogitatively intact. There  were no vitals filed for this visit. There is no height or weight on file to calculate BMI. Patient stated that she has no issues with gait or balance; does not use any assistive devices.  Medical screening examination/treatment/procedure(s) were performed by non-physician practitioner and as supervising provider I was immediately available for consultation/collaboration.  I agree with above. Marrian Salvage, FNP

## 2020-02-21 NOTE — Patient Instructions (Signed)
Carmen Cruz , Thank you for taking time to come for your Medicare Wellness Visit. I appreciate your ongoing commitment to your health goals. Please review the following plan we discussed and let me know if I can assist you in the future.   Screening recommendations/referrals: Colonoscopy: 10/29/2017; due every 10 years Mammogram: 01/21/2019; scheduled for 02/21/2020 Bone Density: never done Recommended yearly ophthalmology/optometry visit for glaucoma screening and checkup Recommended yearly dental visit for hygiene and checkup  Vaccinations: Influenza vaccine: 01/04/2020 Pneumococcal vaccine: 12/15/2018, 01/06/2020 Tdap vaccine: 11/19/2017; due every 10 years Shingles vaccine: never done   Covid-19: 06/06/2019, 06/27/2019, 01/10/2020  Advanced directives: Please bring a copy of your health care power of attorney and living will to the office at your convenience.  Conditions/risks identified: Yes; Reviewed health maintenance screenings with patient today and relevant education, vaccines, and/or referrals were provided. Please continue to do your personal lifestyle choices by: daily care of teeth and gums, regular physical activity (goal should be 5 days a week for 30 minutes), eat a healthy diet, avoid tobacco and drug use, limiting any alcohol intake, taking a low-dose aspirin (if not allergic or have been advised by your provider otherwise) and taking vitamins and minerals as recommended by your provider. Continue doing brain stimulating activities (puzzles, reading, adult coloring books, staying active) to keep memory sharp. Continue to eat heart healthy diet (full of fruits, vegetables, whole grains, lean protein, water--limit salt, fat, and sugar intake) and increase physical activity as tolerated.  Next appointment:    Preventive Care 21 Years and Older, Female Preventive care refers to lifestyle choices and visits with your health care provider that can promote health and wellness. What does  preventive care include?  A yearly physical exam. This is also called an annual well check.  Dental exams once or twice a year.  Routine eye exams. Ask your health care provider how often you should have your eyes checked.  Personal lifestyle choices, including:  Daily care of your teeth and gums.  Regular physical activity.  Eating a healthy diet.  Avoiding tobacco and drug use.  Limiting alcohol use.  Practicing safe sex.  Taking low-dose aspirin every day.  Taking vitamin and mineral supplements as recommended by your health care provider. What happens during an annual well check? The services and screenings done by your health care provider during your annual well check will depend on your age, overall health, lifestyle risk factors, and family history of disease. Counseling  Your health care provider may ask you questions about your:  Alcohol use.  Tobacco use.  Drug use.  Emotional well-being.  Home and relationship well-being.  Sexual activity.  Eating habits.  History of falls.  Memory and ability to understand (cognition).  Work and work Statistician.  Reproductive health. Screening  You may have the following tests or measurements:  Height, weight, and BMI.  Blood pressure.  Lipid and cholesterol levels. These may be checked every 5 years, or more frequently if you are over 36 years old.  Skin check.  Lung cancer screening. You may have this screening every year starting at age 24 if you have a 30-pack-year history of smoking and currently smoke or have quit within the past 15 years.  Fecal occult blood test (FOBT) of the stool. You may have this test every year starting at age 29.  Flexible sigmoidoscopy or colonoscopy. You may have a sigmoidoscopy every 5 years or a colonoscopy every 10 years starting at age 46.  Hepatitis C  blood test.  Hepatitis B blood test.  Sexually transmitted disease (STD) testing.  Diabetes screening. This  is done by checking your blood sugar (glucose) after you have not eaten for a while (fasting). You may have this done every 1-3 years.  Bone density scan. This is done to screen for osteoporosis. You may have this done starting at age 69.  Mammogram. This may be done every 1-2 years. Talk to your health care provider about how often you should have regular mammograms. Talk with your health care provider about your test results, treatment options, and if necessary, the need for more tests. Vaccines  Your health care provider may recommend certain vaccines, such as:  Influenza vaccine. This is recommended every year.  Tetanus, diphtheria, and acellular pertussis (Tdap, Td) vaccine. You may need a Td booster every 10 years.  Zoster vaccine. You may need this after age 35.  Pneumococcal 13-valent conjugate (PCV13) vaccine. One dose is recommended after age 68.  Pneumococcal polysaccharide (PPSV23) vaccine. One dose is recommended after age 2. Talk to your health care provider about which screenings and vaccines you need and how often you need them. This information is not intended to replace advice given to you by your health care provider. Make sure you discuss any questions you have with your health care provider. Document Released: 04/20/2015 Document Revised: 12/12/2015 Document Reviewed: 01/23/2015 Elsevier Interactive Patient Education  2017 [employer] Prevention in the Home Falls can cause injuries. They can happen to people of all ages. There are many things you can do to make your home safe and to help prevent falls. What can I do on the outside of my home?  Regularly fix the edges of walkways and driveways and fix any cracks.  Remove anything that might make you trip as you walk through a door, such as a raised step or threshold.  Trim any bushes or trees on the path to your home.  Use bright outdoor lighting.  Clear any walking paths of anything that might make  someone trip, such as rocks or tools.  Regularly check to see if handrails are loose or broken. Make sure that both sides of any steps have handrails.  Any raised decks and porches should have guardrails on the edges.  Have any leaves, snow, or ice cleared regularly.  Use sand or salt on walking paths during winter.  Clean up any spills in your garage right away. This includes oil or grease spills. What can I do in the bathroom?  Use night lights.  Install grab bars by the toilet and in the tub and shower. Do not use towel bars as grab bars.  Use non-skid mats or decals in the tub or shower.  If you need to sit down in the shower, use a plastic, non-slip stool.  Keep the floor dry. Clean up any water that spills on the floor as soon as it happens.  Remove soap buildup in the tub or shower regularly.  Attach bath mats securely with double-sided non-slip rug tape.  Do not have throw rugs and other things on the floor that can make you trip. What can I do in the bedroom?  Use night lights.  Make sure that you have a light by your bed that is easy to reach.  Do not use any sheets or blankets that are too big for your bed. They should not hang down onto the floor.  Have a firm chair that has side arms. You  can use this for support while you get dressed.  Do not have throw rugs and other things on the floor that can make you trip. What can I do in the kitchen?  Clean up any spills right away.  Avoid walking on wet floors.  Keep items that you use a lot in easy-to-reach places.  If you need to reach something above you, use a strong step stool that has a grab bar.  Keep electrical cords out of the way.  Do not use floor polish or wax that makes floors slippery. If you must use wax, use non-skid floor wax.  Do not have throw rugs and other things on the floor that can make you trip. What can I do with my stairs?  Do not leave any items on the stairs.  Make sure that  there are handrails on both sides of the stairs and use them. Fix handrails that are broken or loose. Make sure that handrails are as long as the stairways.  Check any carpeting to make sure that it is firmly attached to the stairs. Fix any carpet that is loose or worn.  Avoid having throw rugs at the top or bottom of the stairs. If you do have throw rugs, attach them to the floor with carpet tape.  Make sure that you have a light switch at the top of the stairs and the bottom of the stairs. If you do not have them, ask someone to add them for you. What else can I do to help prevent falls?  Wear shoes that:  Do not have high heels.  Have rubber bottoms.  Are comfortable and fit you well.  Are closed at the toe. Do not wear sandals.  If you use a stepladder:  Make sure that it is fully opened. Do not climb a closed stepladder.  Make sure that both sides of the stepladder are locked into place.  Ask someone to hold it for you, if possible.  Clearly mark and make sure that you can see:  Any grab bars or handrails.  First and last steps.  Where the edge of each step is.  Use tools that help you move around (mobility aids) if they are needed. These include:  Canes.  Walkers.  Scooters.  Crutches.  Turn on the lights when you go into a dark area. Replace any light bulbs as soon as they burn out.  Set up your furniture so you have a clear path. Avoid moving your furniture around.  If any of your floors are uneven, fix them.  If there are any pets around you, be aware of where they are.  Review your medicines with your doctor. Some medicines can make you feel dizzy. This can increase your chance of falling. Ask your doctor what other things that you can do to help prevent falls. This information is not intended to replace advice given to you by your health care provider. Make sure you discuss any questions you have with your health care provider. Document Released:  01/18/2009 Document Revised: 08/30/2015 Document Reviewed: 04/28/2014 Elsevier Interactive Patient Education  2017 [employer].

## 2020-02-21 NOTE — Telephone Encounter (Signed)
Results given.

## 2020-03-14 ENCOUNTER — Other Ambulatory Visit: Payer: Self-pay

## 2020-03-14 ENCOUNTER — Ambulatory Visit: Payer: Medicare HMO | Admitting: Cardiology

## 2020-03-14 ENCOUNTER — Encounter: Payer: Self-pay | Admitting: Nurse Practitioner

## 2020-03-14 VITALS — BP 160/92 | HR 77 | Ht 67.0 in | Wt 152.1 lb

## 2020-03-14 DIAGNOSIS — I447 Left bundle-branch block, unspecified: Secondary | ICD-10-CM | POA: Diagnosis not present

## 2020-03-14 DIAGNOSIS — E785 Hyperlipidemia, unspecified: Secondary | ICD-10-CM

## 2020-03-14 DIAGNOSIS — R9431 Abnormal electrocardiogram [ECG] [EKG]: Secondary | ICD-10-CM | POA: Diagnosis not present

## 2020-03-14 MED ORDER — ROSUVASTATIN CALCIUM 10 MG PO TABS: 10.0000 mg | ORAL_TABLET | Freq: Every day | ORAL | 11 refills | Status: DC

## 2020-03-14 NOTE — Patient Instructions (Addendum)
Medication Instructions:  Your physician has recommended you make the following change in your medication:  START Rosuvastatin (Crestor) 10 mg once daily with heaviest meal  *If you need a refill on your cardiac medications before your next appointment, please call your pharmacy*   Lab Work: Your physician recommends that you return for lab work in: 3 months on Thursday March 10 You may come in for lab work anytime on this date between the hours of 7:30 am and 4:30 pm You will need to FAST for this appointment - nothing to eat or drink after midnight the night before except water.  If you have labs (blood work) drawn today and your tests are completely normal, you will receive your results only by: Marland Kitchen MyChart Message (if you have MyChart) OR . A paper copy in the mail If you have any lab test that is abnormal or we need to change your treatment, we will call you to review the results.   Testing/Procedures: Your physician has requested that you have a lexiscan myoview. For further information please visit [email or web address]. Please follow instruction sheet, as given.  Your physician has requested that you have an echocardiogram. Echocardiography is a painless test that uses sound waves to create images of your heart. It provides your doctor with information about the size and shape of your heart and how well your heart's chambers and valves are working. This procedure takes approximately one hour. There are no restrictions for this procedure.    Follow-Up: At [facility], you and your health needs are our priority.  As part of our continuing mission to provide you with exceptional heart care, we have created designated Provider Care Teams.  These Care Teams include your primary Cardiologist (physician) and Advanced Practice Providers (APPs -  Physician Assistants and Nurse Practitioners) who all work together to provide you with the care you need, when you need it.  We recommend signing  up for the patient portal called "MyChart".  Sign up information is provided on this After Visit Summary.  MyChart is used to connect with patients for Virtual Visits (Telemedicine).  Patients are able to view lab/test results, encounter notes, upcoming appointments, etc.  Non-urgent messages can be sent to your provider as well.   To learn more about what you can do with MyChart, go to [email or web address].    Your next appointment:   1 year(s)  The format for your next appointment:   In Person  Provider:   You may see Candee Furbish, MD or one of the following Advanced Practice Providers on your designated Care Team:    Truitt Merle, NP  Cecilie Kicks, NP  Kathyrn Drown, NP

## 2020-03-14 NOTE — Progress Notes (Signed)
Cardiology Office Note:    Date:  03/14/2020   ID:  Carmen Cruz, DOB 01-30-1951, MRN [id number]  PCP:  Marrian Salvage, [place] Cardiologist:  Candee Furbish, MD  Advance Electrophysiologist:  None   Referring MD: Marrian Salvage,*     History of Present Illness:    Carmen Cruz is a 69 y.o. female here for the evaluation of abnormal EKG at the request of Dr. Jodi Mourning.  EKG on 02/20/2020 personally reviewed and interpreted demonstrates sinus rhythm with left bundle branch block.  Newly discovered.  Overall feels well no fevers chills nausea vomiting syncope bleeding.  No chest pain no shortness of breath.  Quite active.  Takes amlodipine 5 mg for hypertension.  Newly started.  LDL 179 at last check  Brother had heart issue, ?valve replaced or stent.  Mother died   Past Medical History:  Diagnosis Date  . Chicken pox   . History of colon polyps     Past Surgical History:  Procedure Laterality Date  . TOOTH EXTRACTION    . TUBAL LIGATION      Current Medications: Current Meds  Medication Sig  . amLODipine (NORVASC) 5 MG tablet Take 1 tablet (5 mg total) by mouth daily.  . APPLE CIDER VINEGAR PO Take 450 mg by mouth.  . calcium carbonate (OS-CAL) 600 MG TABS tablet Take 600 mg by mouth 3 (three) times daily with meals.  . Coenzyme Q10 (COQ10) 100 MG CAPS Take by mouth.  . Multiple Vitamin (MULTIVITAMIN) tablet Take 1 tablet by mouth daily.  . NON FORMULARY Fat Burner Green Tea  . OVER THE COUNTER MEDICATION Vitamin D 3 one capsule daily.  Marland Kitchen OVER THE COUNTER MEDICATION Fish Oil, 1200 units three tablets daily.  Marland Kitchen OVER THE COUNTER MEDICATION Vitamin E 400 units one capsule daily.  Marland Kitchen OVER THE COUNTER MEDICATION Red Yeast Rice, two capsules once a day.  . zinc gluconate 50 MG tablet Take 50 mg by mouth daily.     Allergies:   Patient has no known allergies.   Social History   Socioeconomic History  . Marital status:  Married    Spouse name: Not on file  . Number of children: Not on file  . Years of education: Not on file  . Highest education level: Not on file  Occupational History  . Not on file  Tobacco Use  . Smoking status: Former Research scientist (life sciences)  . Smokeless tobacco: Never Used  . Tobacco comment: Quit 8 years ago.  Vaping Use  . Vaping Use: Never used  Substance and Sexual Activity  . Alcohol use: Yes    Comment: socially  . Drug use: Never  . Sexual activity: Not on file  Other Topics Concern  . Not on file  Social History Narrative  . Not on file   Social Determinants of Health   Financial Resource Strain: Low Risk   . Difficulty of Paying Living Expenses: Not hard at all  Food Insecurity: No Food Insecurity  . Worried About Charity fundraiser in the Last Year: Never true  . Ran Out of Food in the Last Year: Never true  Transportation Needs: No Transportation Needs  . Lack of Transportation (Medical): No  . Lack of Transportation (Non-Medical): No  Physical Activity: Sufficiently Active  . Days of Exercise per Week: 5 days  . Minutes of Exercise per Session: 30 min  Stress: No Stress Concern Present  . Feeling of Stress :  Not at all  Social Connections: Socially Integrated  . Frequency of Communication with Friends and Family: More than three times a week  . Frequency of Social Gatherings with Friends and Family: More than three times a week  . Attends Religious Services: More than 4 times per year  . Active Member of Clubs or Organizations: Yes  . Attends Archivist Meetings: More than 4 times per year  . Marital Status: Married     Family History: The patient's family history includes Breast cancer in her paternal aunt; High Cholesterol in her father and mother; High blood pressure in her father and mother; Stroke in her mother. There is no history of Colon cancer, Esophageal cancer, Liver cancer, Pancreatic cancer, Rectal cancer, or Stomach cancer.  ROS:   Please  see the history of present illness.    Denies any fevers chills nausea vomiting syncope bleeding all other systems reviewed and are negative.  EKGs/Labs/Other Studies Reviewed:    The following studies were reviewed today: Prior EKG reviewed, CT scan of neck reviewed, Dr. Charlton Amor note reviewed  EKG: EKG from 02/20/2020 shows sinus rhythm with left bundle branch block  Recent Labs: 02/20/2020: ALT 18; BUN 13; Creatinine, Ser 0.52; Hemoglobin 15.8; Platelets 337.0; Potassium 4.0; Sodium 139; TSH 0.69  Recent Lipid Panel    Component Value Date/Time   CHOL 267 (H) 02/20/2020 1000   TRIG 239.0 (H) 02/20/2020 1000   HDL 63.60 02/20/2020 1000   CHOLHDL 4 02/20/2020 1000   VLDL 47.8 (H) 02/20/2020 1000   LDLCALC 161 (H) 02/18/2019 0953   LDLDIRECT 179.0 02/20/2020 1000     Risk Assessment/Calculations:       Physical Exam:    VS:  BP (!) 160/92 (BP Location: Left Arm, Patient Position: Sitting, Cuff Size: Normal)   Pulse 77   Ht 5\' 7"  (1.702 m)   Wt 152 lb 2 oz (69 kg)   SpO2 96%   BMI 23.83 kg/m     Wt Readings from Last 3 Encounters:  03/14/20 152 lb 2 oz (69 kg)  02/20/20 154 lb (69.9 kg)  02/18/19 155 lb 12.8 oz (70.7 kg)     GEN:  Well nourished, well developed in no acute distress HEENT: Normal NECK: No JVD; No carotid bruits LYMPHATICS: No lymphadenopathy CARDIAC: RRR, no murmurs, rubs, gallops RESPIRATORY:  Clear to auscultation without rales, wheezing or rhonchi  ABDOMEN: Soft, non-tender, non-distended MUSCULOSKELETAL:  No edema; No deformity  SKIN: Warm and dry NEUROLOGIC:  Alert and oriented x 3 PSYCHIATRIC:  Normal affect   ASSESSMENT:    1. New onset left bundle branch block (LBBB)   2. Nonspecific abnormal electrocardiogram (ECG) (EKG)   3. Hyperlipidemia, unspecified hyperlipidemia type    PLAN:    In order of problems listed above:  Newly discovered left bundle branch block -We will check a pharmacologic stress test nuclear. -Check an  echocardiogram to ensure proper structure and function and to exclude cardiomyopathy -Currently asymptomatic no chest pain no shortness of breath. -Understands if potential further deterioration of electrical system occurs, could warrant pacemaker use.  Essential hypertension -Agree with amlodipine 5 mg a day.  Continue further titration per Dr. Valere Dross.  Elevated today in the office setting.   Hyperlipidemia with aortic atherosclerosis -Last LDL 179. -She has been on red yeast rice extract for quite some time. -I will go ahead and start her on Crestor 10 mg a day. -I personally reviewed her CT scan from 2016 of her neck and  she does have aortic arch atherosclerosis noted.  I think it is important for her to be on statin therapy. -We will repeat lipid panel and ALT in 3 months.  We will follow up with the results of testing.  Otherwise 1 year follow-up.    Shared Decision Making/Informed Consent    The risks [chest pain, shortness of breath, cardiac arrhythmias, dizziness, blood pressure fluctuations, myocardial infarction, stroke/transient ischemic attack, nausea, vomiting, allergic reaction, radiation exposure, metallic taste sensation and life-threatening complications (estimated to be 1 in 10,000)], benefits (risk stratification, diagnosing coronary artery disease, treatment guidance) and alternatives of a nuclear stress test were discussed in detail with Carmen Cruz and she agrees to proceed.       Medication Adjustments/Labs and Tests Ordered: Current medicines are reviewed at length with the patient today.  Concerns regarding medicines are outlined above.  Orders Placed This Encounter  Procedures  . ALT  . Lipid Profile  . MYOCARDIAL PERFUSION IMAGING  . ECHOCARDIOGRAM COMPLETE   Meds ordered this encounter  Medications  . rosuvastatin (CRESTOR) 10 MG tablet    Sig: Take 1 tablet (10 mg total) by mouth daily.    Dispense:  30 tablet    Refill:  11    Patient  Instructions  Medication Instructions:  Your physician has recommended you make the following change in your medication:  START Rosuvastatin (Crestor) 10 mg once daily with heaviest meal  *If you need a refill on your cardiac medications before your next appointment, please call your pharmacy*   Lab Work: Your physician recommends that you return for lab work in: 3 months on Thursday March 10 You may come in for lab work anytime on this date between the hours of 7:30 am and 4:30 pm You will need to FAST for this appointment - nothing to eat or drink after midnight the night before except water.  If you have labs (blood work) drawn today and your tests are completely normal, you will receive your results only by: Marland Kitchen MyChart Message (if you have MyChart) OR . A paper copy in the mail If you have any lab test that is abnormal or we need to change your treatment, we will call you to review the results.   Testing/Procedures: Your physician has requested that you have a lexiscan myoview. For further information please visit [email or web address]. Please follow instruction sheet, as given.  Your physician has requested that you have an echocardiogram. Echocardiography is a painless test that uses sound waves to create images of your heart. It provides your doctor with information about the size and shape of your heart and how well your heart's chambers and valves are working. This procedure takes approximately one hour. There are no restrictions for this procedure.    Follow-Up: At [facility], you and your health needs are our priority.  As part of our continuing mission to provide you with exceptional heart care, we have created designated Provider Care Teams.  These Care Teams include your primary Cardiologist (physician) and Advanced Practice Providers (APPs -  Physician Assistants and Nurse Practitioners) who all work together to provide you with the care you need, when you need it.  We  recommend signing up for the patient portal called "MyChart".  Sign up information is provided on this After Visit Summary.  MyChart is used to connect with patients for Virtual Visits (Telemedicine).  Patients are able to view lab/test results, encounter notes, upcoming appointments, etc.  Non-urgent messages can be sent to your  provider as well.   To learn more about what you can do with MyChart, go to [email or web address].    Your next appointment:   1 year(s)  The format for your next appointment:   In Person  Provider:   You may see Candee Furbish, MD or one of the following Advanced Practice Providers on your designated Care Team:    Truitt Merle, NP  Cecilie Kicks, NP  Kathyrn Drown, NP        Signed, Candee Furbish, MD  03/14/2020 3:15 PM    [place]

## 2020-03-15 ENCOUNTER — Ambulatory Visit: Payer: Medicare HMO | Admitting: Cardiology

## 2020-03-19 ENCOUNTER — Telehealth: Payer: Self-pay | Admitting: Family

## 2020-03-19 ENCOUNTER — Other Ambulatory Visit: Payer: Self-pay | Admitting: Family

## 2020-03-19 MED ORDER — AMLODIPINE BESYLATE 10 MG PO TABS: 10.0000 mg | ORAL_TABLET | Freq: Every day | ORAL | 1 refills | Status: DC

## 2020-03-19 NOTE — Telephone Encounter (Signed)
   1.Medication Requested: amLODipine (NORVASC) 5 MG tablet  2. Pharmacy (Name, Street, [place]):[place], [place] [address]  3. On Med List: yes  4. Last Visit with PCP: 02/23/20  5. Next visit date with PCP: 07/23/20   Agent: Please be advised that RX refills may take up to 3 business days. We ask that you follow-up with your pharmacy.

## 2020-03-19 NOTE — Telephone Encounter (Signed)
Her blood pressure was still not controlled when she saw Dr. Marlou Porch. He agrees with the medication choice but asked that dosage be increased; will send in Amlodipine 10 mg for her; she can obviously take 2 of what she currently has at home to use those up.

## 2020-03-22 ENCOUNTER — Telehealth (HOSPITAL_COMMUNITY): Payer: Self-pay | Admitting: *Deleted

## 2020-03-22 NOTE — Telephone Encounter (Signed)
Patient given detailed instructions per Myocardial Perfusion Study Information Sheet for the test on  03/28/20 Patient notified to arrive 15 minutes early and that it is imperative to arrive on time for appointment to keep from having the test rescheduled.  If you need to cancel or reschedule your appointment, please call the office within 24 hours of your appointment. . Patient verbalized understanding. Kirstie Peri

## 2020-03-28 ENCOUNTER — Ambulatory Visit (HOSPITAL_BASED_OUTPATIENT_CLINIC_OR_DEPARTMENT_OTHER): Payer: Medicare HMO

## 2020-03-28 ENCOUNTER — Ambulatory Visit (HOSPITAL_COMMUNITY): Payer: Medicare HMO | Attending: Cardiovascular Disease

## 2020-03-28 ENCOUNTER — Other Ambulatory Visit: Payer: Self-pay

## 2020-03-28 DIAGNOSIS — I447 Left bundle-branch block, unspecified: Secondary | ICD-10-CM

## 2020-03-28 DIAGNOSIS — R9431 Abnormal electrocardiogram [ECG] [EKG]: Secondary | ICD-10-CM | POA: Diagnosis not present

## 2020-03-28 LAB — MYOCARDIAL PERFUSION IMAGING
LV dias vol: 81 mL (ref 46–106)
LV sys vol: 22 mL
Peak HR: 88 {beats}/min
Rest HR: 65 {beats}/min
SDS: 2
SRS: 0
SSS: 2
TID: 1.07

## 2020-03-28 LAB — ECHOCARDIOGRAM COMPLETE
Area-P 1/2: 4.21 cm2
Height: 67 in
S' Lateral: 3.1 cm
Weight: 2432 oz

## 2020-03-28 MED ORDER — TECHNETIUM TC 99M TETROFOSMIN IV KIT
10.1000 | PACK | Freq: Once | INTRAVENOUS | Status: AC | PRN
  Administered 2020-03-28: 10.1 via INTRAVENOUS
  Filled 2020-03-28: qty 11

## 2020-03-28 MED ORDER — REGADENOSON 0.4 MG/5ML IV SOLN
0.4000 mg | Freq: Once | INTRAVENOUS | Status: AC
  Administered 2020-03-28: 0.4 mg via INTRAVENOUS

## 2020-03-28 MED ORDER — TECHNETIUM TC 99M TETROFOSMIN IV KIT
31.9000 | PACK | Freq: Once | INTRAVENOUS | Status: AC | PRN
  Administered 2020-03-28: 31.9 via INTRAVENOUS
  Filled 2020-03-28: qty 32

## 2020-04-02 ENCOUNTER — Telehealth: Payer: Self-pay

## 2020-04-02 NOTE — Telephone Encounter (Signed)
The patient has been notified of the result and verbalized understanding.  All questions (if any) were answered. Wilma Flavin, RN 04/02/2020 3:53 PM

## 2020-04-02 NOTE — Telephone Encounter (Signed)
-----   Message from Jake Bathe, MD sent at 04/02/2020  3:44 PM EST ----- Normal left ventricular pump function.  No evidence of ischemia noted. Donato Schultz, MD

## 2020-04-02 NOTE — Telephone Encounter (Signed)
The patient has been notified of the result and verbalized understanding.  All questions (if any) were answered. Moriya Mitchell R Katalaya Beel, RN 04/02/2020 3:53 PM   

## 2020-04-02 NOTE — Telephone Encounter (Signed)
-----   Message from Jake Bathe, MD sent at 04/02/2020  3:45 PM EST ----- Normal left ventricular pump function.  Mild mitral valve regurgitation.  Overall reassuring. Donato Schultz, MD

## 2020-04-10 ENCOUNTER — Encounter (HOSPITAL_COMMUNITY): Payer: Medicare HMO

## 2020-04-10 ENCOUNTER — Other Ambulatory Visit (HOSPITAL_COMMUNITY): Payer: Medicare HMO

## 2020-05-07 DIAGNOSIS — Z20822 Contact with and (suspected) exposure to covid-19: Secondary | ICD-10-CM | POA: Diagnosis not present

## 2020-05-07 DIAGNOSIS — Z03818 Encounter for observation for suspected exposure to other biological agents ruled out: Secondary | ICD-10-CM | POA: Diagnosis not present

## 2020-06-10 ENCOUNTER — Other Ambulatory Visit: Payer: Self-pay | Admitting: Family

## 2020-06-11 ENCOUNTER — Other Ambulatory Visit: Payer: Self-pay | Admitting: Family

## 2020-06-13 ENCOUNTER — Other Ambulatory Visit: Payer: Medicare HMO

## 2020-06-14 ENCOUNTER — Other Ambulatory Visit: Payer: Medicare HMO

## 2020-06-15 ENCOUNTER — Other Ambulatory Visit: Payer: Medicare HMO | Admitting: *Deleted

## 2020-06-15 ENCOUNTER — Other Ambulatory Visit: Payer: Self-pay

## 2020-06-15 DIAGNOSIS — R9431 Abnormal electrocardiogram [ECG] [EKG]: Secondary | ICD-10-CM

## 2020-06-15 DIAGNOSIS — E785 Hyperlipidemia, unspecified: Secondary | ICD-10-CM | POA: Diagnosis not present

## 2020-06-15 DIAGNOSIS — I447 Left bundle-branch block, unspecified: Secondary | ICD-10-CM | POA: Diagnosis not present

## 2020-06-15 LAB — LIPID PANEL
Chol/HDL Ratio: 2.3 ratio (ref 0.0–4.4)
Cholesterol, Total: 159 mg/dL (ref 100–199)
HDL: 68 mg/dL (ref >39)
LDL Chol Calc (NIH): 66 mg/dL (ref 0–99)
Triglycerides: 148 mg/dL (ref 0–149)
VLDL Cholesterol Cal: 25 mg/dL (ref 5–40)

## 2020-06-15 LAB — ALT: ALT: 31 IU/L (ref 0–32)

## 2020-07-23 ENCOUNTER — Other Ambulatory Visit: Payer: Self-pay

## 2020-07-23 ENCOUNTER — Encounter: Payer: Self-pay | Admitting: Family

## 2020-07-23 ENCOUNTER — Ambulatory Visit (INDEPENDENT_AMBULATORY_CARE_PROVIDER_SITE_OTHER): Payer: Medicare HMO | Admitting: Family

## 2020-07-23 VITALS — BP 160/70 | HR 55 | Temp 98.6°F | Ht 67.0 in | Wt 156.0 lb

## 2020-07-23 DIAGNOSIS — E785 Hyperlipidemia, unspecified: Secondary | ICD-10-CM | POA: Diagnosis not present

## 2020-07-23 DIAGNOSIS — I10 Essential (primary) hypertension: Secondary | ICD-10-CM

## 2020-07-23 DIAGNOSIS — R232 Flushing: Secondary | ICD-10-CM | POA: Diagnosis not present

## 2020-07-23 LAB — BASIC METABOLIC PANEL
BUN: 13 mg/dL (ref 6–23)
CO2: 31 mEq/L (ref 19–32)
Calcium: 9.6 mg/dL (ref 8.4–10.5)
Chloride: 104 mEq/L (ref 96–112)
Creatinine, Ser: 0.51 mg/dL (ref 0.40–1.20)
GFR: 96.45 mL/min (ref >60.00)
Glucose, Bld: 112 mg/dL — ABNORMAL HIGH (ref 70–99)
Potassium: 4.6 mEq/L (ref 3.5–5.1)
Sodium: 141 mEq/L (ref 135–145)

## 2020-07-23 LAB — CBC WITH DIFFERENTIAL/PLATELET
Basophils Absolute: 0 10*3/uL (ref 0.0–0.1)
Basophils Relative: 0.8 % (ref 0.0–3.0)
Eosinophils Absolute: 0.1 10*3/uL (ref 0.0–0.7)
Eosinophils Relative: 2 % (ref 0.0–5.0)
HCT: 44.1 % (ref 36.0–46.0)
Hemoglobin: 14.9 g/dL (ref 12.0–15.0)
Lymphocytes Relative: 32.1 % (ref 12.0–46.0)
Lymphs Abs: 1.8 10*3/uL (ref 0.7–4.0)
MCHC: 33.9 g/dL (ref 30.0–36.0)
MCV: 95 fl (ref 78.0–100.0)
Monocytes Absolute: 0.6 10*3/uL (ref 0.1–1.0)
Monocytes Relative: 11.5 % (ref 3.0–12.0)
Neutro Abs: 3 10*3/uL (ref 1.4–7.7)
Neutrophils Relative %: 53.6 % (ref 43.0–77.0)
Platelets: 340 10*3/uL (ref 150.0–400.0)
RBC: 4.64 Mil/uL (ref 3.87–5.11)
RDW: 12.6 % (ref 11.5–15.5)
WBC: 5.6 10*3/uL (ref 4.0–10.5)

## 2020-07-23 MED ORDER — VALSARTAN 80 MG PO TABS: 80.0000 mg | ORAL_TABLET | Freq: Every day | ORAL | 0 refills | Status: DC

## 2020-07-23 NOTE — Progress Notes (Signed)
Carmen Cruz is a 71 y.o. female with the following history as recorded in EpicCare:  There are no problems to display for this patient.   Current Outpatient Medications  Medication Sig Dispense Refill  . amLODipine (NORVASC) 10 MG tablet Take 1 tablet (10 mg total) by mouth daily. 90 tablet 1  . APPLE CIDER VINEGAR PO Take 450 mg by mouth.    . calcium carbonate (OS-CAL) 600 MG TABS tablet Take 600 mg by mouth 3 (three) times daily with meals.    . Coenzyme Q10 (COQ10) 100 MG CAPS Take by mouth.    . Multiple Vitamin (MULTIVITAMIN) tablet Take 1 tablet by mouth daily.    . NON FORMULARY Fat Burner Green Tea    . OVER THE COUNTER MEDICATION Vitamin D 3 one capsule daily.    Marland Kitchen OVER THE COUNTER MEDICATION Fish Oil, 1200 units three tablets daily.    Marland Kitchen OVER THE COUNTER MEDICATION Vitamin E 400 units one capsule daily.    Marland Kitchen OVER THE COUNTER MEDICATION Red Yeast Rice, two capsules once a day.    . rosuvastatin (CRESTOR) 10 MG tablet Take 1 tablet (10 mg total) by mouth daily. 30 tablet 11  . valsartan (DIOVAN) 80 MG tablet Take 1 tablet (80 mg total) by mouth daily. 90 tablet 0  . zinc gluconate 50 MG tablet Take 50 mg by mouth daily.     No current facility-administered medications for this visit.    Allergies: Patient has no known allergies.  Past Medical History:  Diagnosis Date  . Chicken pox   . History of colon polyps     Past Surgical History:  Procedure Laterality Date  . TOOTH EXTRACTION    . TUBAL LIGATION      Family History  Problem Relation Age of Onset  . High Cholesterol Mother   . High blood pressure Mother   . Stroke Mother   . High Cholesterol Father   . High blood pressure Father   . Breast cancer Paternal Aunt   . Colon cancer Neg Hx   . Esophageal cancer Neg Hx   . Liver cancer Neg Hx   . Pancreatic cancer Neg Hx   . Rectal cancer Neg Hx   . Stomach cancer Neg Hx     Social History   Tobacco Use  . Smoking status: Former Research scientist (life sciences)  . Smokeless  tobacco: Never Used  . Tobacco comment: Quit 8 years ago.  Substance Use Topics  . Alcohol use: Yes    Comment: socially    Subjective:   6 month follow-up on hypertension; tolerating medication well; Denies any chest pain, shortness of breath, blurred vision or headache. Does not check her blood pressure regularly at home; is taking Amlodipine 10 mg daily as prescribed;  Was started on statin by her cardiologist- labs were checked there last month;  Does note that she gets "hot" at night'/ does get "hot flashes"; menopause almost 20 years ago;     Objective:  Vitals:   07/23/20 0842  BP: (!) 160/70  Pulse: (!) 55  Temp: 98.6 F (37 C)  TempSrc: Oral  SpO2: 98%  Weight: 156 lb (70.8 kg)  Height: 5\' 7"  (1.702 m)    General: Well developed, well nourished, in no acute distress  Skin : Warm and dry.  Head: Normocephalic and atraumatic  Eyes: Sclera and conjunctiva clear; pupils round and reactive to light; extraocular movements intact  Ears: External normal; canals clear; tympanic membranes normal  Oropharynx:  Pink, supple. No suspicious lesions  Neck: Supple without thyromegaly, adenopathy  Lungs: Respirations unlabored; clear to auscultation bilaterally without wheeze, rales, rhonchi  CVS exam: normal rate and regular rhythm.  Neurologic: Alert and oriented; speech intact; face symmetrical; moves all extremities well; CNII-XII intact without focal deficit   Assessment:  1. Primary hypertension   2. Hot flashes   3. Hyperlipidemia, unspecified hyperlipidemia type     Plan:  1. & 2. ? If sensation of being "hot"is due to uncontrolled hypertension; TSH was normal when last checked; try adding Diovan 80 mg daily; plan to re-check in 1 month; 3. Continue Crestor; labs were checked last month; see cardiology in December;  This visit occurred during the SARS-CoV-2 public health emergency.  Safety protocols were in place, including screening questions prior to the visit,  additional usage of staff PPE, and extensive cleaning of exam room while observing appropriate contact time as indicated for disinfecting solutions.     No follow-ups on file.  Orders Placed This Encounter  Procedures  . CBC with Differential/Platelet    Standing Status:   Future    Number of Occurrences:   1    Standing Expiration Date:   07/23/2021  . Basic Metabolic Panel (BMET)    Standing Status:   Future    Number of Occurrences:   1    Standing Expiration Date:   07/23/2021    Requested Prescriptions   Signed Prescriptions Disp Refills  . valsartan (DIOVAN) 80 MG tablet 90 tablet 0    Sig: Take 1 tablet (80 mg total) by mouth daily.

## 2020-07-23 NOTE — Patient Instructions (Signed)
   [facility] [address], [place], [place] [address]  Please let me know what you prefer to do as far as follow up; you will need to be seen in 1 month;

## 2020-08-28 ENCOUNTER — Ambulatory Visit (INDEPENDENT_AMBULATORY_CARE_PROVIDER_SITE_OTHER): Payer: Medicare HMO | Admitting: Family

## 2020-08-28 ENCOUNTER — Other Ambulatory Visit: Payer: Self-pay

## 2020-08-28 ENCOUNTER — Encounter: Payer: Self-pay | Admitting: Family

## 2020-08-28 VITALS — BP 138/78 | HR 65 | Temp 98.4°F | Ht 67.0 in | Wt 157.4 lb

## 2020-08-28 DIAGNOSIS — R7303 Prediabetes: Secondary | ICD-10-CM

## 2020-08-28 DIAGNOSIS — I10 Essential (primary) hypertension: Secondary | ICD-10-CM

## 2020-08-28 LAB — POCT GLYCOSYLATED HEMOGLOBIN (HGB A1C): Hemoglobin A1C: 5.9 % — AB (ref 4.0–5.6)

## 2020-08-28 MED ORDER — VALSARTAN 80 MG PO TABS: 80.0000 mg | ORAL_TABLET | Freq: Every day | ORAL | 1 refills | Status: DC

## 2020-08-28 MED ORDER — AMLODIPINE BESYLATE 10 MG PO TABS: 10.0000 mg | ORAL_TABLET | Freq: Every day | ORAL | 1 refills | Status: DC

## 2020-08-28 NOTE — Progress Notes (Signed)
Naira Standiford Osei is a 70 y.o. female with the following history as recorded in EpicCare:  There are no problems to display for this patient.   Current Outpatient Medications  Medication Sig Dispense Refill  . APPLE CIDER VINEGAR PO Take 450 mg by mouth.    . calcium carbonate (OS-CAL) 600 MG TABS tablet Take 600 mg by mouth 3 (three) times daily with meals.    . Coenzyme Q10 (COQ10) 100 MG CAPS Take by mouth.    . Multiple Vitamin (MULTIVITAMIN) tablet Take 1 tablet by mouth daily.    . NON FORMULARY Fat Burner Green Tea    . OVER THE COUNTER MEDICATION Vitamin D 3 one capsule daily.    Marland Kitchen OVER THE COUNTER MEDICATION Fish Oil, 1200 units three tablets daily.    Marland Kitchen OVER THE COUNTER MEDICATION Vitamin E 400 units one capsule daily.    Marland Kitchen OVER THE COUNTER MEDICATION Red Yeast Rice, two capsules once a day.    . rosuvastatin (CRESTOR) 10 MG tablet Take 1 tablet (10 mg total) by mouth daily. 30 tablet 11  . zinc gluconate 50 MG tablet Take 50 mg by mouth daily.    Marland Kitchen amLODipine (NORVASC) 10 MG tablet Take 1 tablet (10 mg total) by mouth daily. 90 tablet 1  . valsartan (DIOVAN) 80 MG tablet Take 1 tablet (80 mg total) by mouth daily. 90 tablet 1   No current facility-administered medications for this visit.    Allergies: Patient has no known allergies.  Past Medical History:  Diagnosis Date  . Chicken pox   . History of colon polyps     Past Surgical History:  Procedure Laterality Date  . TOOTH EXTRACTION    . TUBAL LIGATION      Family History  Problem Relation Age of Onset  . High Cholesterol Mother   . High blood pressure Mother   . Stroke Mother   . High Cholesterol Father   . High blood pressure Father   . Breast cancer Paternal Aunt   . Colon cancer Neg Hx   . Esophageal cancer Neg Hx   . Liver cancer Neg Hx   . Pancreatic cancer Neg Hx   . Rectal cancer Neg Hx   . Stomach cancer Neg Hx     Social History   Tobacco Use  . Smoking status: Former Research scientist (life sciences)  . Smokeless  tobacco: Never Used  . Tobacco comment: Quit 8 years ago.  Substance Use Topics  . Alcohol use: Yes    Comment: socially    Subjective:   1 month follow up on hypertension; at last OV, Diovan 80 mg daily was added to Amlodipine 10 mg daily; doing well on combination- Denies any chest pain, shortness of breath, blurred vision or headache; has been checking her pressure at home and is consistently well controlled- averaging 130/70;    Objective:  Vitals:   08/28/20 0837  BP: 138/78  Pulse: 65  Temp: 98.4 F (36.9 C)  TempSrc: Oral  SpO2: 99%  Weight: 157 lb 6.4 oz (71.4 kg)  Height: 5\' 7"  (1.702 m)    General: Well developed, well nourished, in no acute distress  Skin : Warm and dry.  Head: Normocephalic and atraumatic  Lungs: Respirations unlabored; clear to auscultation bilaterally without wheeze, rales, rhonchi  CVS exam: normal rate and regular rhythm.  Neurologic: Alert and oriented; speech intact; face symmetrical; moves all extremities well; CNII-XII intact without focal deficit   Assessment:  1. Primary hypertension  2. Prediabetes     Plan:  1. Good improvement- stable; continue same medications; refills updated; 2. Hgba1c is at 5.9; question if this is false elevation due to recent month long cruise; will re-check in 6 months; continue to follow healthy diet and exercise;  This visit occurred during the SARS-CoV-2 public health emergency.  Safety protocols were in place, including screening questions prior to the visit, additional usage of staff PPE, and extensive cleaning of exam room while observing appropriate contact time as indicated for disinfecting solutions.     Return in about 6 months (around 02/28/2021).  Orders Placed This Encounter  Procedures  . POCT HgB A1C    Requested Prescriptions   Signed Prescriptions Disp Refills  . amLODipine (NORVASC) 10 MG tablet 90 tablet 1    Sig: Take 1 tablet (10 mg total) by mouth daily.  . valsartan (DIOVAN)  80 MG tablet 90 tablet 1    Sig: Take 1 tablet (80 mg total) by mouth daily.

## 2020-08-28 NOTE — Progress Notes (Signed)
A1C POC done at 5.9 results.

## 2020-09-11 DIAGNOSIS — H5203 Hypermetropia, bilateral: Secondary | ICD-10-CM | POA: Diagnosis not present

## 2020-09-11 DIAGNOSIS — H2513 Age-related nuclear cataract, bilateral: Secondary | ICD-10-CM | POA: Diagnosis not present

## 2020-09-11 DIAGNOSIS — H35443 Age-related reticular degeneration of retina, bilateral: Secondary | ICD-10-CM | POA: Diagnosis not present

## 2020-11-01 ENCOUNTER — Telehealth (INDEPENDENT_AMBULATORY_CARE_PROVIDER_SITE_OTHER): Payer: Medicare HMO | Admitting: Internal Medicine

## 2020-11-01 ENCOUNTER — Encounter: Payer: Self-pay | Admitting: Internal Medicine

## 2020-11-01 ENCOUNTER — Other Ambulatory Visit: Payer: Self-pay

## 2020-11-01 VITALS — BP 124/65 | HR 63 | Temp 98.0°F | Ht 67.0 in | Wt 155.0 lb

## 2020-11-01 DIAGNOSIS — U071 COVID-19: Secondary | ICD-10-CM | POA: Diagnosis not present

## 2020-11-01 MED ORDER — MOLNUPIRAVIR EUA 200MG CAPSULE: 4.0000 | ORAL_CAPSULE | Freq: Two times a day (BID) | ORAL | 0 refills | Status: AC

## 2020-11-01 NOTE — Progress Notes (Signed)
Subjective:    Patient ID: Carmen Cruz, female    DOB: 09/10/1950, 70 y.o.   MRN: [id number]  DOS:  11/01/2020 Type of visit - description: Virtual Visit via Video Note  I connected with the above patient  by a video enabled telemedicine application and verified that I am speaking with the correct person using two identifiers.   THIS ENCOUNTER IS A VIRTUAL VISIT DUE TO COVID-19 - PATIENT WAS NOT SEEN IN THE OFFICE. PATIENT HAS CONSENTED TO VIRTUAL VISIT / TELEMEDICINE VISIT   Location of patient: home  Location of provider: office  Persons participating in the virtual visit: patient, provider   I discussed the limitations of evaluation and management by telemedicine and the availability of in person appointments. The patient expressed understanding and agreed to proceed.  Acute: Symptoms a started 4 days ago: Sore throat, chills, sneezing, cough. The next day she started with a low-grade temp around 99 degrees. Yesterday she got a COVID test and it came back positive. Some other family members are also affected.  Denies chest pain or difficulty breathing No nausea or vomiting No headache or rash She is taking Tylenol only  Review of Systems See above   Past Medical History:  Diagnosis Date   Chicken pox    History of colon polyps     Past Surgical History:  Procedure Laterality Date   TOOTH EXTRACTION     TUBAL LIGATION      Allergies as of 11/01/2020   No Known Allergies      Medication List        Accurate as of November 01, 2020 11:59 PM. If you have any questions, ask your nurse or doctor.          amLODipine 10 MG tablet Commonly known as: NORVASC Take 1 tablet (10 mg total) by mouth daily.   APPLE CIDER VINEGAR PO Take 450 mg by mouth.   calcium carbonate 600 MG Tabs tablet Commonly known as: OS-CAL Take 600 mg by mouth 3 (three) times daily with meals.   CoQ10 100 MG Caps Take by mouth.   molnupiravir EUA 200 mg Caps Take 4 capsules  (800 mg total) by mouth 2 (two) times daily for 5 days. Started by: Kathlene November, MD   multivitamin tablet Take 1 tablet by mouth daily.   NON FORMULARY Fat Burner Green Tea   OVER THE COUNTER MEDICATION Vitamin D 3 one capsule daily.   OVER THE COUNTER MEDICATION Fish Oil, 1200 units three tablets daily.   OVER THE COUNTER MEDICATION Vitamin E 400 units one capsule daily.   OVER THE COUNTER MEDICATION Red Yeast Rice, two capsules once a day.   rosuvastatin 10 MG tablet Commonly known as: CRESTOR Take 1 tablet (10 mg total) by mouth daily.   valsartan 80 MG tablet Commonly known as: DIOVAN Take 1 tablet (80 mg total) by mouth daily.   zinc gluconate 50 MG tablet Take 50 mg by mouth daily.           Objective:   Physical Exam BP 124/65   Pulse 63   Temp 98 F (36.7 C)   Ht '5\' 7"'$  (1.702 m)   Wt 155 lb (70.3 kg)   BMI 24.28 kg/m  This is a virtual video visit, alert oriented x3, in no distress, some nasal congestion noted, no cough or chest congestion.    Assessment      70 year old female, PMH includes high cholesterol, hypertension, she had 3 COVID  vaccines. Presents with:  COVID-19 infection. As described above, vital signs are stable, she does not look toxic. We agreed on the following Molnupiravir Rest, fluids, Tylenol, Robitussin-DM. Discussed isolation x10 days since the onset of symptoms particularly with her husband. Definitely call if not gradually better. She verbalized understanding along with above. Final recommend a COVID booster in 1 month  I discussed the assessment and treatment plan with the patient. The patient was provided an opportunity to ask questions and all were answered. The patient agreed with the plan and demonstrated an understanding of the instructions.   The patient was advised to call back or seek an in-person evaluation if the symptoms worsen or if the condition fails to improve as anticipated.

## 2020-11-09 ENCOUNTER — Other Ambulatory Visit: Payer: Self-pay | Admitting: Family

## 2020-11-09 ENCOUNTER — Telehealth: Payer: Self-pay

## 2020-11-09 MED ORDER — DOXYCYCLINE HYCLATE 100 MG PO TABS: 100.0000 mg | ORAL_TABLET | Freq: Two times a day (BID) | ORAL | 0 refills | Status: DC

## 2020-11-09 NOTE — Telephone Encounter (Signed)
I have called pt back and relayed the message from the provider. The rx has been sent to pharmacy and she will get it picked up today. She is also aware that if she is not feeling better after this course of antibiotics then she needs to make an OV.

## 2020-11-09 NOTE — Telephone Encounter (Signed)
I have called pt and gathered some more information. Pt reports that she is not feeling much better after the Covid. She is still feeling tired and coughing up some flam. Pt states that she has sinus pressure with a burning nose.   I have informed pt that I will route this message to the provider for further recommendations. Pharmacy has been confirmed.

## 2020-11-09 NOTE — Telephone Encounter (Signed)
Pt called stating she had a virtual visit with Dr. Larose Kells on 7/28 and she is still experiencing coughing & clear mucous drainage.  She still feels a little tired.  She did complete the antivirals that were prescribed from that virtual visit.  She is wondering if she needs an antibiotic now.  Please advise.

## 2020-12-03 ENCOUNTER — Telehealth: Payer: Self-pay | Admitting: Family

## 2020-12-03 NOTE — Telephone Encounter (Signed)
Pt. Called in stating that it is time for a refill and she is supposed to be switched to 1 BP pill instead of taking 2.  RX can be sent to: [facility] [address] [place]  [place], [place] [address]  Phone: [phone]

## 2020-12-04 ENCOUNTER — Other Ambulatory Visit: Payer: Self-pay | Admitting: Family

## 2020-12-04 MED ORDER — AMLODIPINE BESYLATE-VALSARTAN 5-160 MG PO TABS: 1.0000 | ORAL_TABLET | Freq: Every day | ORAL | 0 refills | Status: DC

## 2020-12-04 NOTE — Telephone Encounter (Signed)
I have called pt back and got clarification. Pt is asking for a combo pill with both medication. Is there one out there for that or is it better for her to take the medications sperate.   I have tried to pend the med, however I can not find one with amLODipine 10 and Valsartan 80.

## 2020-12-04 NOTE — Telephone Encounter (Signed)
I have called pt back and relayed the message from the provider in detail. She stated that she would prefer to do a combo pill so she has to only remember to take one. Pt also stated, " if Carmen Cruz says that I have to take 2 pills then I will. I trust her and I will do whatever she says is best". I stated understanding and will notify the provider.

## 2020-12-04 NOTE — Telephone Encounter (Signed)
I have called pt back and relayed the message from the provider. She was happy to switch to one pill for her BP and she will pick up meds and start tomorrow. She will take the med for a week and then start taking her BP once daily to keep a log. I have notified her if she starts to have any side effects and if it cost too much to also let us know. Pt reported understanding. I have wished her a happy early birthday!

## 2021-01-09 ENCOUNTER — Other Ambulatory Visit: Payer: Self-pay | Admitting: Family

## 2021-01-09 DIAGNOSIS — Z1231 Encounter for screening mammogram for malignant neoplasm of breast: Secondary | ICD-10-CM

## 2021-01-31 DIAGNOSIS — H5203 Hypermetropia, bilateral: Secondary | ICD-10-CM | POA: Diagnosis not present

## 2021-02-22 ENCOUNTER — Ambulatory Visit
Admission: RE | Admit: 2021-02-22 | Discharge: 2021-02-22 | Disposition: A | Discharge status: Home / Self Care | Payer: Medicare HMO | Source: Ambulatory Visit | Attending: Family | Admitting: Family

## 2021-02-22 DIAGNOSIS — Z1231 Encounter for screening mammogram for malignant neoplasm of breast: Secondary | ICD-10-CM

## 2021-03-04 ENCOUNTER — Other Ambulatory Visit: Payer: Self-pay

## 2021-03-05 ENCOUNTER — Ambulatory Visit (INDEPENDENT_AMBULATORY_CARE_PROVIDER_SITE_OTHER): Payer: Medicare HMO | Admitting: Family

## 2021-03-05 ENCOUNTER — Encounter: Payer: Medicare HMO | Admitting: Family

## 2021-03-05 ENCOUNTER — Encounter: Payer: Self-pay | Admitting: Family

## 2021-03-05 VITALS — BP 140/88 | HR 56 | Temp 97.8°F | Ht 65.5 in | Wt 161.0 lb

## 2021-03-05 DIAGNOSIS — Z1159 Encounter for screening for other viral diseases: Secondary | ICD-10-CM

## 2021-03-05 DIAGNOSIS — R7303 Prediabetes: Secondary | ICD-10-CM

## 2021-03-05 DIAGNOSIS — Z13220 Encounter for screening for lipoid disorders: Secondary | ICD-10-CM

## 2021-03-05 DIAGNOSIS — Z0000 Encounter for general adult medical examination without abnormal findings: Secondary | ICD-10-CM

## 2021-03-05 LAB — CBC WITH DIFFERENTIAL/PLATELET
Basophils Absolute: 0 10*3/uL (ref 0.0–0.1)
Basophils Relative: 0.6 % (ref 0.0–3.0)
Eosinophils Absolute: 0.1 10*3/uL (ref 0.0–0.7)
Eosinophils Relative: 1.4 % (ref 0.0–5.0)
HCT: 41.8 % (ref 36.0–46.0)
Hemoglobin: 14.1 g/dL (ref 12.0–15.0)
Lymphocytes Relative: 30.1 % (ref 12.0–46.0)
Lymphs Abs: 1.5 10*3/uL (ref 0.7–4.0)
MCHC: 33.8 g/dL (ref 30.0–36.0)
MCV: 95.5 fl (ref 78.0–100.0)
Monocytes Absolute: 0.5 10*3/uL (ref 0.1–1.0)
Monocytes Relative: 10.7 % (ref 3.0–12.0)
Neutro Abs: 2.8 10*3/uL (ref 1.4–7.7)
Neutrophils Relative %: 57.2 % (ref 43.0–77.0)
Platelets: 317 10*3/uL (ref 150.0–400.0)
RBC: 4.37 Mil/uL (ref 3.87–5.11)
RDW: 12.8 % (ref 11.5–15.5)
WBC: 4.9 10*3/uL (ref 4.0–10.5)

## 2021-03-05 LAB — COMPREHENSIVE METABOLIC PANEL
ALT: 34 U/L (ref 0–35)
AST: 30 U/L (ref 0–37)
Albumin: 4.6 g/dL (ref 3.5–5.2)
Alkaline Phosphatase: 69 U/L (ref 39–117)
BUN: 9 mg/dL (ref 6–23)
CO2: 30 mEq/L (ref 19–32)
Calcium: 10.2 mg/dL (ref 8.4–10.5)
Chloride: 100 mEq/L (ref 96–112)
Creatinine, Ser: 0.52 mg/dL (ref 0.40–1.20)
GFR: 95.58 mL/min (ref >60.00)
Glucose, Bld: 112 mg/dL — ABNORMAL HIGH (ref 70–99)
Potassium: 4 mEq/L (ref 3.5–5.1)
Sodium: 139 mEq/L (ref 135–145)
Total Bilirubin: 1 mg/dL (ref 0.2–1.2)
Total Protein: 7 g/dL (ref 6.0–8.3)

## 2021-03-05 LAB — LIPID PANEL
Cholesterol: 184 mg/dL (ref 0–200)
HDL: 77.4 mg/dL (ref >39.00)
LDL Cholesterol: 89 mg/dL (ref 0–99)
NonHDL: 106.25
Total CHOL/HDL Ratio: 2
Triglycerides: 85 mg/dL (ref 0.0–149.0)
VLDL: 17 mg/dL (ref 0.0–40.0)

## 2021-03-05 LAB — TSH: TSH: 0.71 u[IU]/mL (ref 0.35–5.50)

## 2021-03-05 LAB — HEMOGLOBIN A1C: Hgb A1c MFr Bld: 5.9 % (ref 4.6–6.5)

## 2021-03-05 MED ORDER — AMLODIPINE BESYLATE-VALSARTAN 5-160 MG PO TABS: 1.0000 | ORAL_TABLET | Freq: Every day | ORAL | 3 refills | Status: DC

## 2021-03-05 MED ORDER — ROSUVASTATIN CALCIUM 10 MG PO TABS: 10.0000 mg | ORAL_TABLET | Freq: Every day | ORAL | 3 refills | Status: DC

## 2021-03-05 MED ORDER — VALACYCLOVIR HCL 1 G PO TABS: ORAL_TABLET | ORAL | 0 refills | Status: DC

## 2021-03-05 NOTE — Progress Notes (Signed)
Carmen Cruz is a 70 y.o. female with the following history as recorded in EpicCare:  There are no problems to display for this patient.   Current Outpatient Medications  Medication Sig Dispense Refill   APPLE CIDER VINEGAR PO Take 450 mg by mouth.     calcium carbonate (OS-CAL) 600 MG TABS tablet Take 600 mg by mouth 3 (three) times daily with meals.     Coenzyme Q10 (COQ10) 100 MG CAPS Take by mouth.     glucosamine-chondroitin 500-400 MG tablet Take 1 tablet by mouth 2 (two) times daily.     Multiple Vitamin (MULTIVITAMIN) tablet Take 1 tablet by mouth daily.     NON FORMULARY Fat Burner Green Tea     OVER THE COUNTER MEDICATION Vitamin D 3 one capsule daily.     OVER THE COUNTER MEDICATION Fish Oil, 1200 units three tablets daily.     OVER THE COUNTER MEDICATION Vitamin E 400 units one capsule daily.     valACYclovir (VALTREX) 1000 MG tablet Take 2 tablets po bid x 1 day prn for cold sore 20 tablet 0   zinc gluconate 50 MG tablet Take 50 mg by mouth daily.     amLODipine-valsartan (EXFORGE) 5-160 MG tablet Take 1 tablet by mouth daily. 90 tablet 3   BOOSTRIX 5-2.5-18.5 LF-MCG/0.5 injection      FLUZONE HIGH-DOSE QUADRIVALENT 0.7 ML SUSY      OVER THE COUNTER MEDICATION Red Yeast Rice, two capsules once a day. (Patient not taking: Reported on 03/05/2021)     PNEUMOVAX 23 25 MCG/0.5ML injection      rosuvastatin (CRESTOR) 10 MG tablet Take 1 tablet (10 mg total) by mouth daily. 90 tablet 3   No current facility-administered medications for this visit.    Allergies: Patient has no known allergies.  Past Medical History:  Diagnosis Date   Chicken pox    History of colon polyps     Past Surgical History:  Procedure Laterality Date   TOOTH EXTRACTION     TUBAL LIGATION      Family History  Problem Relation Age of Onset   High Cholesterol Mother    High blood pressure Mother    Stroke Mother    High Cholesterol Father    High blood pressure Father    Breast cancer  Paternal Aunt    Colon cancer Neg Hx    Esophageal cancer Neg Hx    Liver cancer Neg Hx    Pancreatic cancer Neg Hx    Rectal cancer Neg Hx    Stomach cancer Neg Hx     Social History   Tobacco Use   Smoking status: Former   Smokeless tobacco: Never   Tobacco comments:    Quit 8 years ago.  Substance Use Topics   Alcohol use: Yes    Comment: socially    Subjective:   Patient presents for yearly CPE; no acute concerns today; Has been alternating her Exforge with her Amlodipine to make prescription last; not checking blood pressure regularly; Up to date on flu shot and pneumonia shot; does not want Shingles vaccine and would be comfortable if it is discontinued as part of her medical records. Up to date on mammogram and colonoscopy; Needs DEXA;  Seeing dentist and eye doctor regularly;   Review of Systems  Constitutional: Negative.   HENT: Negative.    Eyes: Negative.   Respiratory: Negative.    Cardiovascular: Negative.   Gastrointestinal: Negative.   Genitourinary: Negative.  Musculoskeletal: Negative.   Skin: Negative.   Neurological: Negative.   Endo/Heme/Allergies: Negative.   Psychiatric/Behavioral: Negative.      Objective:  Vitals:   03/05/21 0840 03/05/21 0910  BP: (!) 156/70 140/88  Pulse: (!) 56   Temp: 97.8 F (36.6 C)   TempSrc: Oral   SpO2: 96%   Weight: 161 lb (73 kg)   Height: 5' 5.5" (1.664 m)     General: Well developed, well nourished, in no acute distress  Skin : Warm and dry.  Head: Normocephalic and atraumatic  Eyes: Sclera and conjunctiva clear; pupils round and reactive to light; extraocular movements intact  Ears: External normal; canals clear; tympanic membranes normal  Oropharynx: Pink, supple. No suspicious lesions  Neck: Supple without thyromegaly, adenopathy  Lungs: Respirations unlabored; clear to auscultation bilaterally without wheeze, rales, rhonchi  CVS exam: normal rate and regular rhythm.  Abdomen: Soft; nontender;  nondistended; normoactive bowel sounds; no masses or hepatosplenomegaly  Musculoskeletal: No deformities; no active joint inflammation  Extremities: No edema, cyanosis, clubbing  Vessels: Symmetric bilaterally  Neurologic: Alert and oriented; speech intact; face symmetrical; moves all extremities well; CNII-XII intact without focal deficit  Assessment:  1. PE (physical exam), annual   2. Lipid screening   3. Pre-diabetes   4. Need for hepatitis C screening test     Plan:  Age appropriate preventive healthcare needs addressed; encouraged regular eye doctor and dental exams; encouraged regular exercise and weight loss; will update labs and refills as needed today; follow-up to be determined;  She will start checking her blood pressure regularly and forward readings for review in about 2 weeks; suspect elevated as she has not been taking Exforge daily;   This visit occurred during the SARS-CoV-2 public health emergency.  Safety protocols were in place, including screening questions prior to the visit, additional usage of staff PPE, and extensive cleaning of exam room while observing appropriate contact time as indicated for disinfecting solutions.     Return in about 6 months (around 09/02/2021).  Orders Placed This Encounter  Procedures   CBC with Differential/Platelet   Comp Met (CMET)   Lipid panel   TSH   Hemoglobin A1c   Hepatitis C Antibody    Requested Prescriptions   Signed Prescriptions Disp Refills   valACYclovir (VALTREX) 1000 MG tablet 20 tablet 0    Sig: Take 2 tablets po bid x 1 day prn for cold sore   rosuvastatin (CRESTOR) 10 MG tablet 90 tablet 3    Sig: Take 1 tablet (10 mg total) by mouth daily.   amLODipine-valsartan (EXFORGE) 5-160 MG tablet 90 tablet 3    Sig: Take 1 tablet by mouth daily.

## 2021-03-06 LAB — HEPATITIS C ANTIBODY
Hepatitis C Ab: NONREACTIVE
SIGNAL TO CUT-OFF: 0.02 (ref <1.00)

## 2021-03-25 ENCOUNTER — Telehealth: Payer: Self-pay | Admitting: Family

## 2021-03-25 NOTE — Telephone Encounter (Signed)
Patient is calling to tell Jodi Mourning what her bp readings were.   11/30- 149/83 140/77  12/01- 146/75 135/69 12/02- 151/75 140/79 12/03- 139/84 12/04- 129/67 12/05- 117/71 12/06- 134/85  12/07- 141/80 12/08- 136/70 12/09- 126/80  12/10- 116/74 12/11- 123/74 12/12- 128/76 12/13- 120/68 12/14- 132/67

## 2021-03-26 NOTE — Telephone Encounter (Signed)
These readings look good; please stay on the Exforge daily; prescription should be up to date.

## 2021-03-26 NOTE — Telephone Encounter (Signed)
I have called pt and there was no answer so I left a message to call back.

## 2021-03-28 NOTE — Telephone Encounter (Signed)
I have called pt and notified her of the message from the provider and she stated understanding.

## 2021-06-07 ENCOUNTER — Telehealth: Payer: Self-pay | Admitting: *Deleted

## 2021-06-07 ENCOUNTER — Other Ambulatory Visit: Payer: Self-pay | Admitting: Family

## 2021-06-07 MED ORDER — ROSUVASTATIN CALCIUM 5 MG PO TABS: 10.0000 mg | ORAL_TABLET | Freq: Every day | ORAL | 0 refills | Status: DC

## 2021-06-07 MED ORDER — ROSUVASTATIN CALCIUM 20 MG PO TABS: 10.0000 mg | ORAL_TABLET | Freq: Every day | ORAL | 1 refills | Status: DC

## 2021-06-07 NOTE — Telephone Encounter (Signed)
Mickel Baas sent in rx. ?

## 2021-06-07 NOTE — Telephone Encounter (Signed)
[facility] faxed back over stating insurance would not cover 2 5mg  tabs.  Rx sent in for 20mg  1/2 tab qd.  Rx sent in for the 20mg  tabs. ?

## 2021-06-07 NOTE — Addendum Note (Signed)
Addended by: Kem Boroughs D on: 06/07/2021 02:46 PM ? ? Modules accepted: Orders ? ?

## 2021-06-07 NOTE — Telephone Encounter (Signed)
[facility] sent over a fax that rosuvastatin 10mg  is on back order and can they use the 5mg  or 20mg .  Which would you like to choose? ?

## 2021-09-03 ENCOUNTER — Ambulatory Visit (INDEPENDENT_AMBULATORY_CARE_PROVIDER_SITE_OTHER): Payer: Medicare HMO | Admitting: Family

## 2021-09-03 VITALS — BP 112/62 | HR 65 | Temp 97.9°F | Resp 18 | Ht 67.0 in | Wt 160.8 lb

## 2021-09-03 DIAGNOSIS — I8311 Varicose veins of right lower extremity with inflammation: Secondary | ICD-10-CM

## 2021-09-03 DIAGNOSIS — I10 Essential (primary) hypertension: Secondary | ICD-10-CM

## 2021-09-03 DIAGNOSIS — I8312 Varicose veins of left lower extremity with inflammation: Secondary | ICD-10-CM | POA: Diagnosis not present

## 2021-09-03 MED ORDER — ROSUVASTATIN CALCIUM 10 MG PO TABS: 10.0000 mg | ORAL_TABLET | Freq: Every day | ORAL | 3 refills | Status: DC

## 2021-09-03 NOTE — Progress Notes (Signed)
Carmen Cruz is a 71 y.o. female with the following history as recorded in Epic[employer]:  There are no problems to display for this patient.   Current Outpatient Medications  Medication Sig Dispense Refill   amLODipine-valsartan (EXFORGE) 5-160 MG tablet Take 1 tablet by mouth daily. 90 tablet 3   APPLE CIDER VINEGAR PO Take 450 mg by mouth.     calcium carbonate (OS-CAL) 600 MG TABS tablet Take 600 mg by mouth 3 (three) times daily with meals.     Coenzyme Q10 (COQ10) 100 MG CAPS Take by mouth.     glucosamine-chondroitin 500-400 MG tablet Take 1 tablet by mouth 2 (two) times daily.     Multiple Vitamin (MULTIVITAMIN) tablet Take 1 tablet by mouth daily.     NON FORMULARY Fat Burner Green Tea     OVER THE COUNTER MEDICATION Vitamin D 3 one capsule daily.     OVER THE COUNTER MEDICATION Fish Oil, 1200 units three tablets daily.     OVER THE COUNTER MEDICATION Vitamin E 400 units one capsule daily.     OVER THE COUNTER MEDICATION Red Yeast Rice, two capsules once a day.     rosuvastatin (CRESTOR) 10 MG tablet Take 1 tablet (10 mg total) by mouth daily. 90 tablet 3   valACYclovir (VALTREX) 1000 MG tablet Take 2 tablets po bid x 1 day prn for cold sore 20 tablet 0   zinc gluconate 50 MG tablet Take 50 mg by mouth daily.     No current facility-administered medications for this visit.    Allergies: Patient has no known allergies.  Past Medical History:  Diagnosis Date   Chicken pox    History of colon polyps     Past Surgical History:  Procedure Laterality Date   TOOTH EXTRACTION     TUBAL LIGATION      Family History  Problem Relation Age of Onset   High Cholesterol Mother    High blood pressure Mother    Stroke Mother    High Cholesterol Father    High blood pressure Father    Breast cancer Paternal Aunt    Colon cancer Neg Hx    Esophageal cancer Neg Hx    Liver cancer Neg Hx    Pancreatic cancer Neg Hx    Rectal cancer Neg Hx    Stomach cancer Neg Hx     Social  History   Tobacco Use   Smoking status: Former   Smokeless tobacco: Never   Tobacco comments:    Quit 8 years ago.  Substance Use Topics   Alcohol use: Yes    Comment: socially    Subjective:   6 months follow up for hypertension; has been on Exforge and doing well; is continuing to monitor her pressure at home and highest reading is at 138- majority of readings are averaging in the 120s; patient notes she is feeling very good; Denies any chest pain, shortness of breath, blurred vision or headache     Objective:  Vitals:   09/03/21 0845  BP: 112/62  Pulse: 65  Resp: 18  Temp: 97.9 F (36.6 C)  TempSrc: Oral  SpO2: 97%  Weight: 160 lb 12.8 oz (72.9 kg)  Height: '5\' 7"'$  (1.702 m)    General: Well developed, well nourished, in no acute distress  Skin : Warm and dry.  Head: Normocephalic and atraumatic  Eyes: Sclera and conjunctiva clear; pupils round and reactive to light; extraocular movements intact  Lungs: Respirations unlabored; clear  to auscultation bilaterally without wheeze, rales, rhonchi  CVS exam: normal rate and regular rhythm.  Extremities: No edema, cyanosis, clubbing; bilateral varicose veins noted Vessels: Symmetric bilaterally  Neurologic: Alert and oriented; speech intact; face symmetrical; moves all extremities well; CNII-XII intact without focal deficit   Assessment:  1. Primary hypertension   2. Varicose veins of both lower extremities with inflammation     Plan:  Stable; stay on same medication; Refer to [facility] to discuss covered treatment options.   Return in about 6 months (around 03/06/2022) for CPE for you; end of July for your husband.  Orders Placed This Encounter  Procedures   Ambulatory referral to Vascular Surgery    Referral Priority:   Routine    Referral Type:   Surgical    Referral Reason:   Specialty Services Required    Requested Specialty:   Vascular Surgery    Number of Visits Requested:   1    Requested  Prescriptions   Signed Prescriptions Disp Refills   rosuvastatin (CRESTOR) 10 MG tablet 90 tablet 3    Sig: Take 1 tablet (10 mg total) by mouth daily.

## 2021-09-25 ENCOUNTER — Telehealth: Payer: Self-pay

## 2021-09-25 NOTE — Telephone Encounter (Signed)
error 

## 2021-09-30 ENCOUNTER — Ambulatory Visit (INDEPENDENT_AMBULATORY_CARE_PROVIDER_SITE_OTHER): Payer: Medicare HMO

## 2021-09-30 DIAGNOSIS — Z0000 Encounter for general adult medical examination without abnormal findings: Secondary | ICD-10-CM | POA: Diagnosis not present

## 2021-10-10 ENCOUNTER — Other Ambulatory Visit: Payer: Self-pay | Admitting: *Deleted

## 2021-10-10 DIAGNOSIS — M25561 Pain in right knee: Secondary | ICD-10-CM

## 2021-10-30 DIAGNOSIS — H5203 Hypermetropia, bilateral: Secondary | ICD-10-CM | POA: Diagnosis not present

## 2021-10-30 DIAGNOSIS — H35443 Age-related reticular degeneration of retina, bilateral: Secondary | ICD-10-CM | POA: Diagnosis not present

## 2021-10-30 DIAGNOSIS — H2513 Age-related nuclear cataract, bilateral: Secondary | ICD-10-CM | POA: Diagnosis not present

## 2021-11-05 ENCOUNTER — Ambulatory Visit (HOSPITAL_COMMUNITY)
Admission: RE | Admit: 2021-11-05 | Discharge: 2021-11-05 | Disposition: A | Discharge status: Home / Self Care | Payer: Medicare HMO | Source: Ambulatory Visit | Attending: Vascular Surgery | Admitting: Vascular Surgery

## 2021-11-05 DIAGNOSIS — M25561 Pain in right knee: Secondary | ICD-10-CM | POA: Diagnosis not present

## 2021-11-05 DIAGNOSIS — M25562 Pain in left knee: Secondary | ICD-10-CM | POA: Insufficient documentation

## 2021-11-06 ENCOUNTER — Encounter (HOSPITAL_COMMUNITY): Payer: Medicare HMO

## 2021-11-16 NOTE — Progress Notes (Unsigned)
[facility]           [facility] [place]  History and Physical   Carmen Cruz is a 71 y.o. female who presents with BLE varicose veins.  She was seen by her PCP in May and was referred for further evaluation.    The patient has *** history of DVT. Pt does *** history of varicose vein.   Pt does *** history of skin changes in lower legs.   There is *** family history of venous disorders.   The patient has *** used compression stockings in the past.    ***  The pt is on a statin for cholesterol management.  The pt is not on a daily aspirin.   Other AC:  none The pt is on CCB, ARB for hypertension.   The pt is not diabetic.   Tobacco hx:  former   Past Medical History:  Diagnosis Date   Chicken pox    History of colon polyps     Past Surgical History:  Procedure Laterality Date   TOOTH EXTRACTION     TUBAL LIGATION      Social History   Socioeconomic History   Marital status: Married    Spouse name: Not on file   Number of children: Not on file   Years of education: Not on file   Highest education level: Not on file  Occupational History   Not on file  Tobacco Use   Smoking status: Former   Smokeless tobacco: Never   Tobacco comments:    Quit 8 years ago.  Vaping Use   Vaping Use: Never used  Substance and Sexual Activity   Alcohol use: Yes    Comment: socially   Drug use: Never   Sexual activity: Not on file  Other Topics Concern   Not on file  Social History Narrative   Not on file   Social Determinants of Health   Financial Resource Strain: Low Risk  (02/21/2020)   Overall Financial Resource Strain (CARDIA)    Difficulty of Paying Living Expenses: Not hard at all  Food Insecurity: No Food Insecurity (02/21/2020)   Hunger Vital Sign    Worried About Running Out of Food in the Last Year: Never true    Ran Out of Food in the Last Year: Never true  Transportation Needs: No Transportation Needs (02/21/2020)   PRAPARE -  Hydrologist (Medical): No    Lack of Transportation (Non-Medical): No  Physical Activity: Sufficiently Active (02/21/2020)   Exercise Vital Sign    Days of Exercise per Week: 5 days    Minutes of Exercise per Session: 30 min  Stress: No Stress Concern Present (02/21/2020)   [place]    Feeling of Stress : Not at all  Social Connections: [employer] (02/21/2020)   Social Connection and Isolation Panel [NHANES]    Frequency of Communication with Friends and Family: More than three times a week    Frequency of Social Gatherings with Friends and Family: More than three times a week    Attends Religious Services: More than 4 times per year    Active Member of [employer] or Organizations: Yes    Attends Archivist Meetings: More than 4 times per year    Marital Status: Married  Human resources officer Violence: Not At Risk (09/30/2021)   Humiliation, Afraid, Rape, and Kick questionnaire  Fear of Current or Ex-Partner: No    Emotionally Abused: No    Physically Abused: No    Sexually Abused: No    *** Family History  Problem Relation Age of Onset   High Cholesterol Mother    High blood pressure Mother    Stroke Mother    High Cholesterol Father    High blood pressure Father    Breast cancer Paternal Aunt    Colon cancer Neg Hx    Esophageal cancer Neg Hx    Liver cancer Neg Hx    Pancreatic cancer Neg Hx    Rectal cancer Neg Hx    Stomach cancer Neg Hx     Current Outpatient Medications  Medication Sig Dispense Refill   amLODipine-valsartan (EXFORGE) 5-160 MG tablet Take 1 tablet by mouth daily. 90 tablet 3   APPLE CIDER VINEGAR PO Take 450 mg by mouth.     calcium carbonate (OS-CAL) 600 MG TABS tablet Take 600 mg by mouth 3 (three) times daily with meals.     Coenzyme Q10 (COQ10) 100 MG CAPS Take by mouth.     glucosamine-chondroitin 500-400 MG tablet Take 1 tablet by  mouth 2 (two) times daily.     Multiple Vitamin (MULTIVITAMIN) tablet Take 1 tablet by mouth daily.     NON FORMULARY Fat Burner Green Tea     OVER THE COUNTER MEDICATION Vitamin D 3 one capsule daily.     OVER THE COUNTER MEDICATION Fish Oil, 1200 units three tablets daily.     OVER THE COUNTER MEDICATION Vitamin E 400 units one capsule daily.     OVER THE COUNTER MEDICATION Red Yeast Rice, two capsules once a day.     rosuvastatin (CRESTOR) 10 MG tablet Take 1 tablet (10 mg total) by mouth daily. 90 tablet 3   valACYclovir (VALTREX) 1000 MG tablet Take 2 tablets po bid x 1 day prn for cold sore 20 tablet 0   zinc gluconate 50 MG tablet Take 50 mg by mouth daily.     No current facility-administered medications for this visit.    No Known Allergies  REVIEW OF SYSTEMS:  *** '[X]'$  denotes positive finding, '[ ]'$  denotes negative finding Cardiac  Comments:  Chest pain or chest pressure:    Shortness of breath upon exertion:    Short of breath when lying flat:    Irregular heart rhythm:        Vascular    Pain in calf, thigh, or hip brought on by ambulation:    Pain in feet at night that wakes you up from your sleep:     Blood clot in your veins:    Leg swelling:  x       Pulmonary    Oxygen at home:    Productive cough:     Wheezing:         Neurologic    Sudden weakness in arms or legs:     Sudden numbness in arms or legs:     Sudden onset of difficulty speaking or slurred speech:    Temporary loss of vision in one eye:     Problems with dizziness:         Gastrointestinal    Blood in stool:     Vomited blood:         Genitourinary    Burning when urinating:     Blood in urine:        Psychiatric    Major depression:  Hematologic    Bleeding problems:    Problems with blood clotting too easily:        Skin    Rashes or ulcers:        Constitutional    Fever or chills:      PHYSICAL EXAMINATION:  ***  General:  WDWN in NAD; vital signs documented  above Gait: Not observed HENT: WNL, normocephalic Pulmonary: normal non-labored breathing without wheezing Cardiac: {Desc; regular/irreg:14544} HR; {With/Without:20273} carotid bruit*** Abdomen: soft, NT, no masses; aortic pulse is *** palpable Skin: {With/Without:20273} rashes Vascular Exam/Pulses:  Right Left  Radial {Exam; arterial pulse strength 0-4:30167} {Exam; arterial pulse strength 0-4:30167}  DP {Exam; arterial pulse strength 0-4:30167} {Exam; arterial pulse strength 0-4:30167}  PT {Exam; arterial pulse strength 0-4:30167} {Exam; arterial pulse strength 0-4:30167}   Extremities: ***  Neurologic: A&O X 3;  moving all extremities equally Psychiatric:  The pt has {Desc; normal/abnormal:11317::"Normal"} affect.   Non-Invasive Vascular Imaging:   Venous duplex on 8/1/202: Venous Reflux Times  +--------------+---------+------+-----------+------------+--------+  RIGHT         Reflux NoRefluxReflux TimeDiameter cmsComments                          Yes                                   +--------------+---------+------+-----------+------------+--------+  CFV           no                                              +--------------+---------+------+-----------+------------+--------+  FV mid        no                                              +--------------+---------+------+-----------+------------+--------+  Popliteal     no                                              +--------------+---------+------+-----------+------------+--------+  GSV at [facility]    no                            0.66              +--------------+---------+------+-----------+------------+--------+  GSV prox thigh          yes    >500 ms      0.77              +--------------+---------+------+-----------+------------+--------+  GSV mid thigh           yes    >500 ms      0.47    vv        +--------------+---------+------+-----------+------------+--------+   GSV dist thighno                            0.30              +--------------+---------+------+-----------+------------+--------+  GSV at knee   no  0.28              +--------------+---------+------+-----------+------------+--------+  GSV prox calf           yes    >500 ms      0.27              +--------------+---------+------+-----------+------------+--------+  GSV mid calf  no                            0.27              +--------------+---------+------+-----------+------------+--------+  SSV Pop Fossa no                            0.23              +--------------+---------+------+-----------+------------+--------+  SSV prox calf no                            0.22              +--------------+---------+------+-----------+------------+--------+  SSV mid calf  no                            0.25              +--------------+---------+------+-----------+------------+--------+  AASV p        no                            0.13              +--------------+---------+------+-----------+------------+--------+   Summary:  Right:  - No evidence of deep vein thrombosis seen in the right lower extremity, from the common femoral through the popliteal veins.  - No evidence of superficial venous reflux seen in the right short saphenous vein.  - Venous reflux is noted in the right greater saphenous vein in the thigh.  - Venous reflux is noted in the right greater saphenous vein in the calf.        NAKEYA ADINOLFI is a 71 y.o. female who presents with: ***  -pt has *** pedal pulses -pt does not have evidence of DVT.  Pt does have venous reflux in the GSV in the proximal and mid thigh as well as the proximal calf.  She does not have any reflux at the [facility]. -discussed with pt about wearing *** high *** mmHg compression stockings and pt was measured for these today.   *** -discussed the importance of leg elevation and  how to elevate properly - pt is advised to elevate their legs and a diagram is given to them to demonstrate for pt to lay flat on their back with knees elevated and slightly bent with their feet higher than their knees, which puts their feet higher than their heart for 15 minutes per day.  If pt cannot lay flat, advised to lay as flat as possible.  -pt is advised to continue as much walking as possible and avoid sitting or standing for long periods of time.  -discussed importance of weight loss and exercise and that water aerobics would also be beneficial.  -handout with recommendations given -pt will f/u ***   Leontine Locket, [facility] [phone]  Clinic MD:  Carlis Abbott

## 2021-11-19 ENCOUNTER — Ambulatory Visit: Payer: Medicare HMO | Admitting: Physician Assistant

## 2021-11-19 VITALS — BP 139/73 | HR 65 | Temp 98.0°F | Resp 18 | Ht 67.0 in | Wt 160.0 lb

## 2021-11-19 DIAGNOSIS — I8390 Asymptomatic varicose veins of unspecified lower extremity: Secondary | ICD-10-CM | POA: Diagnosis not present

## 2021-11-19 DIAGNOSIS — I8393 Asymptomatic varicose veins of bilateral lower extremities: Secondary | ICD-10-CM

## 2021-11-19 NOTE — Progress Notes (Signed)
Requested by:  Marrian Salvage, [place] [address] [employer] [address],  [place] [id number]  Reason for consultation: bilateral varicose veins    History of Present Illness   Carmen Cruz is a 71 y.o. (1950/04/26) female who presents for evaluation of bilateral varicose veins and reticular veins.  The patient states she has had both varicose and reticular veins on both legs for at least 9 years.  She states that they have gotten worse over the years.  She endorses some bilateral lower leg swelling, however it does not bother her much.  She states that her mother and her grandmother both had reticular veins.  She denies the following venous symptoms in her legs: Aching, heavy, tired, throbbing, burning, itching, bleeding, ulcer. She endorses leg swelling.  She states her leg swelling gets worse after being on her feet all day.  She has not tried compression.  She has tried elevation intermittently.  She has not had any previous vein procedures.  She does not have a history of DVT.  She would primarily like to get her reticular and varicose veins treated because she does not "like the way that they look".  Past Medical History:  Diagnosis Date   Chicken pox    History of colon polyps     Past Surgical History:  Procedure Laterality Date   TOOTH EXTRACTION     TUBAL LIGATION      Social History   Socioeconomic History   Marital status: Married    Spouse name: Not on file   Number of children: Not on file   Years of education: Not on file   Highest education level: Not on file  Occupational History   Not on file  Tobacco Use   Smoking status: Former   Smokeless tobacco: Never   Tobacco comments:    Quit 8 years ago.  Vaping Use   Vaping Use: Never used  Substance and Sexual Activity   Alcohol use: Yes    Comment: socially   Drug use: Never   Sexual activity: Not on file  Other Topics Concern   Not on file  Social History Narrative   Not on file    Social Determinants of Health   Financial Resource Strain: Low Risk  (02/21/2020)   Overall Financial Resource Strain (CARDIA)    Difficulty of Paying Living Expenses: Not hard at all  Food Insecurity: No Food Insecurity (02/21/2020)   Hunger Vital Sign    Worried About Running Out of Food in the Last Year: Never true    Ran Out of Food in the Last Year: Never true  Transportation Needs: No Transportation Needs (02/21/2020)   PRAPARE - Hydrologist (Medical): No    Lack of Transportation (Non-Medical): No  Physical Activity: Sufficiently Active (02/21/2020)   Exercise Vital Sign    Days of Exercise per Week: 5 days    Minutes of Exercise per Session: 30 min  Stress: No Stress Concern Present (02/21/2020)   [facility]    Feeling of Stress : Not at all  Social Connections: [place] (02/21/2020)   Social Connection and Isolation Panel [NHANES]    Frequency of Communication with Friends and Family: More than three times a week    Frequency of Social Gatherings with Friends and Family: More than three times a week    Attends Religious Services: More than 4 times per year  Active Member of Clubs or Organizations: Yes    Attends Archivist Meetings: More than 4 times per year    Marital Status: Married  Human resources officer Violence: Not At Risk (09/30/2021)   Humiliation, Afraid, Rape, and Kick questionnaire    Fear of Current or Ex-Partner: No    Emotionally Abused: No    Physically Abused: No    Sexually Abused: No    Family History  Problem Relation Age of Onset   High Cholesterol Mother    High blood pressure Mother    Stroke Mother    High Cholesterol Father    High blood pressure Father    Breast cancer Paternal Aunt    Colon cancer Neg Hx    Esophageal cancer Neg Hx    Liver cancer Neg Hx    Pancreatic cancer Neg Hx    Rectal cancer Neg Hx    Stomach  cancer Neg Hx     Current Outpatient Medications  Medication Sig Dispense Refill   amLODipine-valsartan (EXFORGE) 5-160 MG tablet Take 1 tablet by mouth daily. 90 tablet 3   APPLE CIDER VINEGAR PO Take 450 mg by mouth.     calcium carbonate (OS-CAL) 600 MG TABS tablet Take 600 mg by mouth 3 (three) times daily with meals.     Coenzyme Q10 (COQ10) 100 MG CAPS Take by mouth.     glucosamine-chondroitin 500-400 MG tablet Take 1 tablet by mouth 2 (two) times daily.     Multiple Vitamin (MULTIVITAMIN) tablet Take 1 tablet by mouth daily.     NON FORMULARY Fat Burner Green Tea     OVER THE COUNTER MEDICATION Vitamin D 3 one capsule daily.     OVER THE COUNTER MEDICATION Fish Oil, 1200 units three tablets daily.     OVER THE COUNTER MEDICATION Vitamin E 400 units one capsule daily.     OVER THE COUNTER MEDICATION Red Yeast Rice, two capsules once a day.     rosuvastatin (CRESTOR) 10 MG tablet Take 1 tablet (10 mg total) by mouth daily. 90 tablet 3   valACYclovir (VALTREX) 1000 MG tablet Take 2 tablets po bid x 1 day prn for cold sore 20 tablet 0   zinc gluconate 50 MG tablet Take 50 mg by mouth daily.     No current facility-administered medications for this visit.    No Known Allergies  REVIEW OF SYSTEMS (negative unless checked):   Cardiac:  '[]'$  Chest pain or chest pressure? '[]'$  Shortness of breath upon activity? '[]'$  Shortness of breath when lying flat? '[]'$  Irregular heart rhythm?  Vascular:  '[]'$  Pain in calf, thigh, or hip brought on by walking? '[]'$  Pain in feet at night that wakes you up from your sleep? '[]'$  Blood clot in your veins? '[x]'$  Leg swelling?  Pulmonary:  '[]'$  Oxygen at home? '[]'$  Productive cough? '[]'$  Wheezing?  Neurologic:  '[]'$  Sudden weakness in arms or legs? '[]'$  Sudden numbness in arms or legs? '[]'$  Sudden onset of difficult speaking or slurred speech? '[]'$  Temporary loss of vision in one eye? '[]'$  Problems with dizziness?  Gastrointestinal:  '[]'$  Blood in stool? '[]'$  Vomited  blood?  Genitourinary:  '[]'$  Burning when urinating? '[]'$  Blood in urine?  Psychiatric:  '[]'$  Major depression  Hematologic:  '[]'$  Bleeding problems? '[]'$  Problems with blood clotting?  Dermatologic:  '[]'$  Rashes or ulcers?  Constitutional:  '[]'$  Fever or chills?  Ear/Nose/Throat:  '[]'$  Change in hearing? '[]'$  Nose bleeds? '[]'$  Sore throat?  Musculoskeletal:  '[]'$  Back  pain? '[]'$  Joint pain? '[]'$  Muscle pain?   Physical Examination     Vitals:   11/19/21 1253  BP: 139/73  Pulse: 65  Resp: 18  Temp: 98 F (36.7 C)  TempSrc: Temporal  SpO2: 96%  Weight: 160 lb (72.6 kg)  Height: '5\' 7"'$  (1.702 m)   Body mass index is 25.06 kg/m.  General:  WDWN in NAD; vital signs documented above Gait: Not observed HENT: WNL, normocephalic Pulmonary: normal non-labored breathing , without Rales, rhonchi,  wheezing Cardiac: regular HR, without murmurs without carotid bruit Abdomen: soft, NT, no masses Skin: without rashes Vascular Exam/Pulses: Palpable DP 2+ pulses bilaterally Extremities: with varicose veins, with reticular veins, without edema, without stasis pigmentation, without lipodermatosclerosis, without ulcers      Musculoskeletal: no muscle wasting or atrophy  Neurologic: A&O X 3;  No focal weakness or paresthesias are detected Psychiatric:  The pt has Normal affect.  Non-invasive Vascular Imaging   RLE Venous Insufficiency Duplex (11/05/2021):  RIGHT         Reflux NoRefluxReflux TimeDiameter cmsComments                          Yes                                   +--------------+---------+------+-----------+------------+--------+  CFV           no                                              +--------------+---------+------+-----------+------------+--------+  FV mid        no                                              +--------------+---------+------+-----------+------------+--------+  Popliteal     no                                               +--------------+---------+------+-----------+------------+--------+  GSV at [facility]    no                            0.66              +--------------+---------+------+-----------+------------+--------+  GSV prox thigh          yes    >500 ms      0.77              +--------------+---------+------+-----------+------------+--------+  GSV mid thigh           yes    >500 ms      0.47    vv        +--------------+---------+------+-----------+------------+--------+  GSV dist thighno                            0.30              +--------------+---------+------+-----------+------------+--------+  GSV at knee   no  0.28              +--------------+---------+------+-----------+------------+--------+  GSV prox calf           yes    >500 ms      0.27              +--------------+---------+------+-----------+------------+--------+  GSV mid calf  no                            0.27              +--------------+---------+------+-----------+------------+--------+  SSV Pop Fossa no                            0.23              +--------------+---------+------+-----------+------------+--------+  SSV prox calf no                            0.22              +--------------+---------+------+-----------+------------+--------+  SSV mid calf  no                            0.25              +--------------+---------+------+-----------+------------+--------+  AASV p        no                            0.13              +--------------+---------+------+-----------+------------+--------+    Medical Decision Making   Carmen Cruz is a 71 y.o. female who presents with: bilateral varicose veins and reticular veins  - Based off the RLE venous duplex, the patient does have reflux in portions of her right greater saphenous vein. However she would not be a candidate for ablation at this time since she does not  have reflux in her SFJ or distal GSV. She has no reflux in her SSV - I discussed with the patient the importance of leg elevation, compression stockings, and exercise to reduce leg swelling.  I have provided her with a handout. - She will need to use thigh-high compression stockings before sclerotherapy treatment.  I have discussed sclerotherapy with the patient for her reticular veins, and she is interested.  I have provided the patient with Ernesta Amble number so they can be in contact about sclerotherapy treatment. -She has 2+ palpable pedal pulses bilaterally -She will follow-up with [place] as needed   Gerri Lins, PA-C [facility]: [phone]  11/19/2021, 12:54 PM  Clinic MD: [facility]

## 2022-01-08 DIAGNOSIS — H35443 Age-related reticular degeneration of retina, bilateral: Secondary | ICD-10-CM | POA: Diagnosis not present

## 2022-01-08 DIAGNOSIS — H2513 Age-related nuclear cataract, bilateral: Secondary | ICD-10-CM | POA: Diagnosis not present

## 2022-01-08 DIAGNOSIS — H16002 Unspecified corneal ulcer, left eye: Secondary | ICD-10-CM | POA: Diagnosis not present

## 2022-01-13 ENCOUNTER — Other Ambulatory Visit: Payer: Self-pay | Admitting: Family

## 2022-01-13 DIAGNOSIS — Z1231 Encounter for screening mammogram for malignant neoplasm of breast: Secondary | ICD-10-CM

## 2022-02-10 ENCOUNTER — Ambulatory Visit (INDEPENDENT_AMBULATORY_CARE_PROVIDER_SITE_OTHER): Payer: Medicare HMO

## 2022-02-10 DIAGNOSIS — I8393 Asymptomatic varicose veins of bilateral lower extremities: Secondary | ICD-10-CM

## 2022-02-10 NOTE — Progress Notes (Signed)
Treated pt's BLE spider and small reticular veins with Asclera 1% administered with a 27 gauge butterfly needle. Pt has several areas of widespread spider and reticular veins. She received a total of 6 mL. She hates needles but tolerated well. She is aware she will likely need more treatments. Pt was given post treatment instructions both verbally and on handout.Marland Kitchen She will call if she has any questions/concerns.   Photos: Yes.    Compression stockings applied: Yes.

## 2022-02-24 ENCOUNTER — Ambulatory Visit
Admission: RE | Admit: 2022-02-24 | Discharge: 2022-02-24 | Disposition: A | Discharge status: Home / Self Care | Payer: Medicare HMO | Source: Ambulatory Visit | Attending: Family | Admitting: Family

## 2022-02-24 DIAGNOSIS — Z1231 Encounter for screening mammogram for malignant neoplasm of breast: Secondary | ICD-10-CM | POA: Diagnosis not present

## 2022-02-28 ENCOUNTER — Other Ambulatory Visit: Payer: Self-pay | Admitting: Family

## 2022-03-04 ENCOUNTER — Other Ambulatory Visit: Payer: Self-pay | Admitting: Family

## 2022-03-11 ENCOUNTER — Encounter: Payer: Self-pay | Admitting: Family

## 2022-03-11 ENCOUNTER — Ambulatory Visit (INDEPENDENT_AMBULATORY_CARE_PROVIDER_SITE_OTHER): Payer: Medicare HMO | Admitting: Family

## 2022-03-11 VITALS — BP 122/78 | HR 63 | Temp 98.1°F | Resp 18 | Ht 67.0 in | Wt 159.2 lb

## 2022-03-11 DIAGNOSIS — R7309 Other abnormal glucose: Secondary | ICD-10-CM | POA: Diagnosis not present

## 2022-03-11 DIAGNOSIS — Z13220 Encounter for screening for lipoid disorders: Secondary | ICD-10-CM | POA: Diagnosis not present

## 2022-03-11 DIAGNOSIS — E785 Hyperlipidemia, unspecified: Secondary | ICD-10-CM | POA: Insufficient documentation

## 2022-03-11 DIAGNOSIS — Z0000 Encounter for general adult medical examination without abnormal findings: Secondary | ICD-10-CM | POA: Diagnosis not present

## 2022-03-11 DIAGNOSIS — E782 Mixed hyperlipidemia: Secondary | ICD-10-CM | POA: Diagnosis not present

## 2022-03-11 DIAGNOSIS — I10 Essential (primary) hypertension: Secondary | ICD-10-CM | POA: Diagnosis not present

## 2022-03-11 LAB — COMPREHENSIVE METABOLIC PANEL
ALT: 29 U/L (ref 0–35)
AST: 26 U/L (ref 0–37)
Albumin: 4.7 g/dL (ref 3.5–5.2)
Alkaline Phosphatase: 63 U/L (ref 39–117)
BUN: 10 mg/dL (ref 6–23)
CO2: 29 mEq/L (ref 19–32)
Calcium: 9.7 mg/dL (ref 8.4–10.5)
Chloride: 101 mEq/L (ref 96–112)
Creatinine, Ser: 0.49 mg/dL (ref 0.40–1.20)
GFR: 96.27 mL/min (ref >60.00)
Glucose, Bld: 104 mg/dL — ABNORMAL HIGH (ref 70–99)
Potassium: 4.3 mEq/L (ref 3.5–5.1)
Sodium: 139 mEq/L (ref 135–145)
Total Bilirubin: 0.8 mg/dL (ref 0.2–1.2)
Total Protein: 7.1 g/dL (ref 6.0–8.3)

## 2022-03-11 LAB — CBC WITH DIFFERENTIAL/PLATELET
Basophils Absolute: 0 10*3/uL (ref 0.0–0.1)
Basophils Relative: 0.7 % (ref 0.0–3.0)
Eosinophils Absolute: 0.1 10*3/uL (ref 0.0–0.7)
Eosinophils Relative: 1.7 % (ref 0.0–5.0)
HCT: 43.1 % (ref 36.0–46.0)
Hemoglobin: 14.6 g/dL (ref 12.0–15.0)
Lymphocytes Relative: 30.8 % (ref 12.0–46.0)
Lymphs Abs: 1.7 10*3/uL (ref 0.7–4.0)
MCHC: 33.9 g/dL (ref 30.0–36.0)
MCV: 95.4 fl (ref 78.0–100.0)
Monocytes Absolute: 0.6 10*3/uL (ref 0.1–1.0)
Monocytes Relative: 11.5 % (ref 3.0–12.0)
Neutro Abs: 3 10*3/uL (ref 1.4–7.7)
Neutrophils Relative %: 55.3 % (ref 43.0–77.0)
Platelets: 326 10*3/uL (ref 150.0–400.0)
RBC: 4.52 Mil/uL (ref 3.87–5.11)
RDW: 13 % (ref 11.5–15.5)
WBC: 5.4 10*3/uL (ref 4.0–10.5)

## 2022-03-11 LAB — LIPID PANEL
Cholesterol: 195 mg/dL (ref 0–200)
HDL: 74.7 mg/dL (ref >39.00)
LDL Cholesterol: 93 mg/dL (ref 0–99)
NonHDL: 120.51
Total CHOL/HDL Ratio: 3
Triglycerides: 136 mg/dL (ref 0.0–149.0)
VLDL: 27.2 mg/dL (ref 0.0–40.0)

## 2022-03-11 LAB — HEMOGLOBIN A1C: Hgb A1c MFr Bld: 6.2 % (ref 4.6–6.5)

## 2022-03-11 MED ORDER — VALACYCLOVIR HCL 1 G PO TABS: ORAL_TABLET | ORAL | 1 refills | Status: DC

## 2022-03-11 MED ORDER — ROSUVASTATIN CALCIUM 10 MG PO TABS: 10.0000 mg | ORAL_TABLET | Freq: Every day | ORAL | 3 refills | Status: DC

## 2022-03-11 MED ORDER — AMLODIPINE BESYLATE-VALSARTAN 5-160 MG PO TABS: 1.0000 | ORAL_TABLET | Freq: Every day | ORAL | 3 refills | Status: DC

## 2022-03-11 NOTE — Progress Notes (Signed)
Carmen Cruz is a 71 y.o. female with the following history as recorded in EpicCare:  There are no problems to display for this patient.   Current Outpatient Medications  Medication Sig Dispense Refill   APPLE CIDER VINEGAR PO Take 450 mg by mouth.     calcium carbonate (OS-CAL) 600 MG TABS tablet Take 600 mg by mouth 3 (three) times daily with meals.     Coenzyme Q10 (COQ10) 100 MG CAPS Take by mouth.     glucosamine-chondroitin 500-400 MG tablet Take 1 tablet by mouth 2 (two) times daily.     Multiple Vitamin (MULTIVITAMIN) tablet Take 1 tablet by mouth daily.     NON FORMULARY Fat Burner Green Tea     OVER THE COUNTER MEDICATION Vitamin D 3 one capsule daily.     OVER THE COUNTER MEDICATION Fish Oil, 1200 units three tablets daily.     OVER THE COUNTER MEDICATION Vitamin E 400 units one capsule daily.     OVER THE COUNTER MEDICATION Red Yeast Rice, two capsules once a day.     zinc gluconate 50 MG tablet Take 50 mg by mouth daily.     amLODipine-valsartan (EXFORGE) 5-160 MG tablet Take 1 tablet by mouth daily. 90 tablet 3   rosuvastatin (CRESTOR) 10 MG tablet Take 1 tablet (10 mg total) by mouth daily. 90 tablet 3   valACYclovir (VALTREX) 1000 MG tablet Take 2 tablets po bid x 1 day prn for cold sore 20 tablet 1   No current facility-administered medications for this visit.    Allergies: Patient has no known allergies.  Past Medical History:  Diagnosis Date   Chicken pox    History of colon polyps    Hyperlipidemia    Hypertension     Past Surgical History:  Procedure Laterality Date   TOOTH EXTRACTION     TUBAL LIGATION      Family History  Problem Relation Age of Onset   High Cholesterol Mother    High blood pressure Mother    Stroke Mother    High Cholesterol Father    High blood pressure Father    Breast cancer Paternal Aunt    Colon cancer Neg Hx    Esophageal cancer Neg Hx    Liver cancer Neg Hx    Pancreatic cancer Neg Hx    Rectal cancer Neg Hx     Stomach cancer Neg Hx     Social History   Tobacco Use   Smoking status: Former   Smokeless tobacco: Never   Tobacco comments:    Quit 8 years ago.  Substance Use Topics   Alcohol use: Yes    Comment: socially    Subjective:   Patient presents for yearly CPE; no acute concerns today;  Is up to date on flu shot; defers DEXA at this time; up to date on dental and eye exams;   Review of Systems  Constitutional: Negative.   HENT: Negative.    Eyes: Negative.   Respiratory: Negative.    Cardiovascular: Negative.   Gastrointestinal: Negative.   Genitourinary: Negative.   Musculoskeletal: Negative.   Skin: Negative.   Neurological: Negative.   Endo/Heme/Allergies: Negative.   Psychiatric/Behavioral: Negative.        Objective:  Vitals:   03/11/22 0838  BP: 122/78  Pulse: 63  Resp: 18  Temp: 98.1 F (36.7 C)  TempSrc: Oral  SpO2: 96%  Weight: 159 lb 3.2 oz (72.2 kg)  Height: _0  (1.702 m)  General: Well developed, well nourished, in no acute distress  Skin : Warm and dry.  Head: Normocephalic and atraumatic  Eyes: Sclera and conjunctiva clear; pupils round and reactive to light; extraocular movements intact  Ears: External normal; canals clear; tympanic membranes normal  Oropharynx: Pink, supple. No suspicious lesions  Neck: Supple without thyromegaly, adenopathy  Lungs: Respirations unlabored; clear to auscultation bilaterally without wheeze, rales, rhonchi  CVS exam: normal rate and regular rhythm.  Abdomen: Soft; nontender; nondistended; normoactive bowel sounds; no masses or hepatosplenomegaly  Musculoskeletal: No deformities; no active joint inflammation  Extremities: No edema, cyanosis, clubbing  Vessels: Symmetric bilaterally  Neurologic: Alert and oriented; speech intact; face symmetrical; moves all extremities well; CNII-XII intact without focal deficit  Assessment:  1. PE (physical exam), annual   2. Lipid screening   3. Elevated glucose      Plan:  Age appropriate preventive healthcare needs addressed; encouraged regular eye doctor and dental exams; encouraged regular exercise; will update labs and refills as needed today; follow-up in 1 year, sooner prn.    No follow-ups on file.  Orders Placed This Encounter  Procedures   CBC with Differential/Platelet   Comp Met (CMET)   Lipid panel   Hemoglobin A1c    Requested Prescriptions   Signed Prescriptions Disp Refills   amLODipine-valsartan (EXFORGE) 5-160 MG tablet 90 tablet 3    Sig: Take 1 tablet by mouth daily.   rosuvastatin (CRESTOR) 10 MG tablet 90 tablet 3    Sig: Take 1 tablet (10 mg total) by mouth daily.   valACYclovir (VALTREX) 1000 MG tablet 20 tablet 1    Sig: Take 2 tablets po bid x 1 day prn for cold sore

## 2022-08-11 DIAGNOSIS — K642 Third degree hemorrhoids: Secondary | ICD-10-CM | POA: Diagnosis not present

## 2022-08-11 DIAGNOSIS — K641 Second degree hemorrhoids: Secondary | ICD-10-CM | POA: Diagnosis not present

## 2022-09-26 IMAGING — MG DIGITAL SCREENING BILAT W/ TOMO W/ CAD
6 of 10 series · 6 of 30 positions shown · non-contrast
Comparison: Previous exam(s).

CLINICAL DATA: Screening.

EXAM:
DIGITAL SCREENING BILATERAL MAMMOGRAM WITH TOMO AND CAD

[L CC synth-2D]
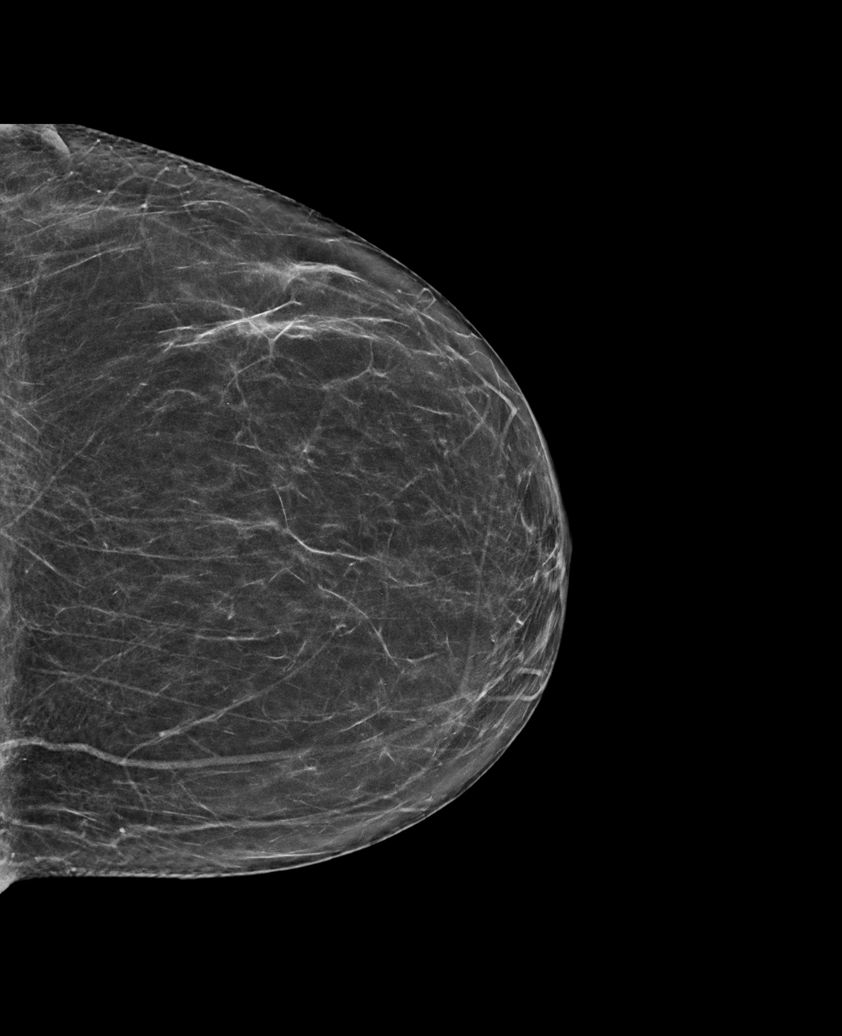

[R CC synth-2D]
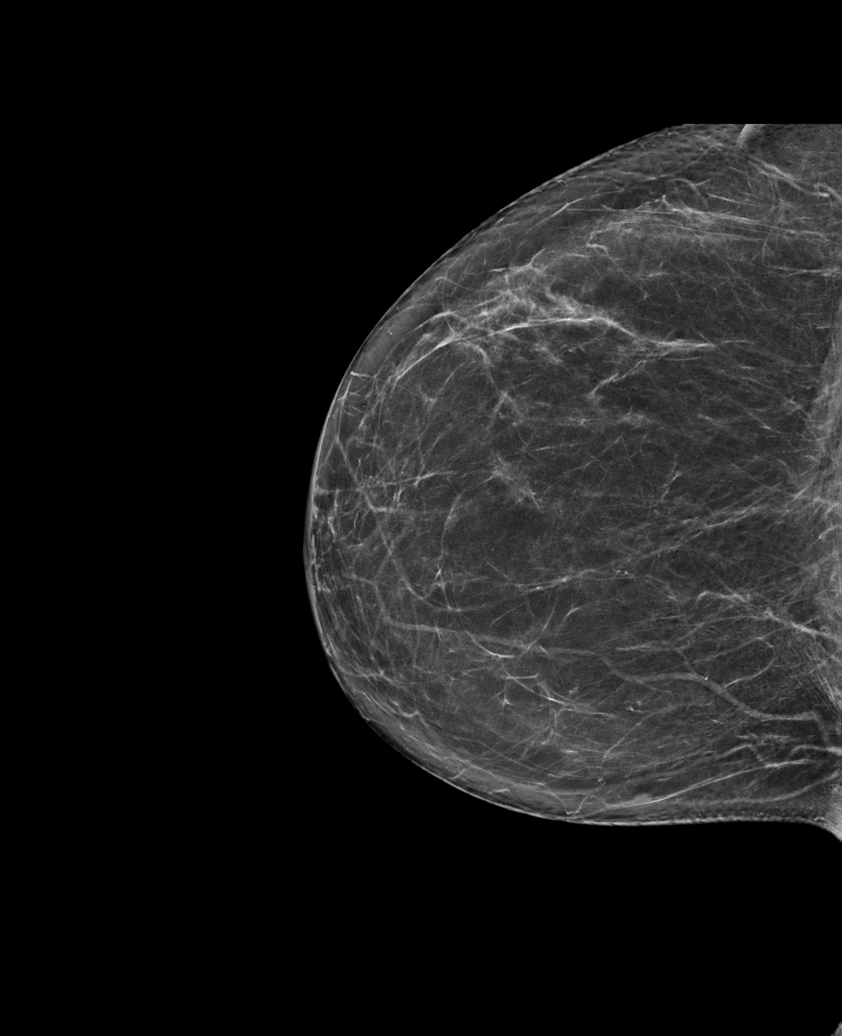

[L MLO synth-2D (1 of 2)]
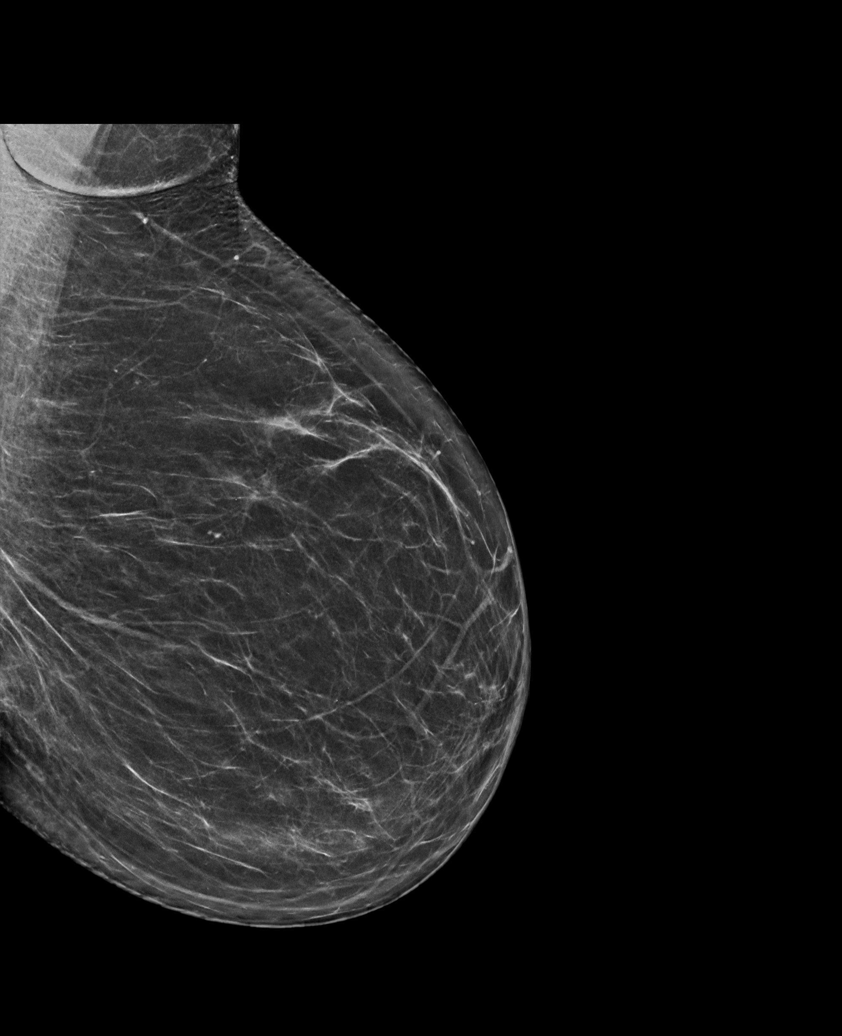

[L MLO synth-2D (2 of 2)]
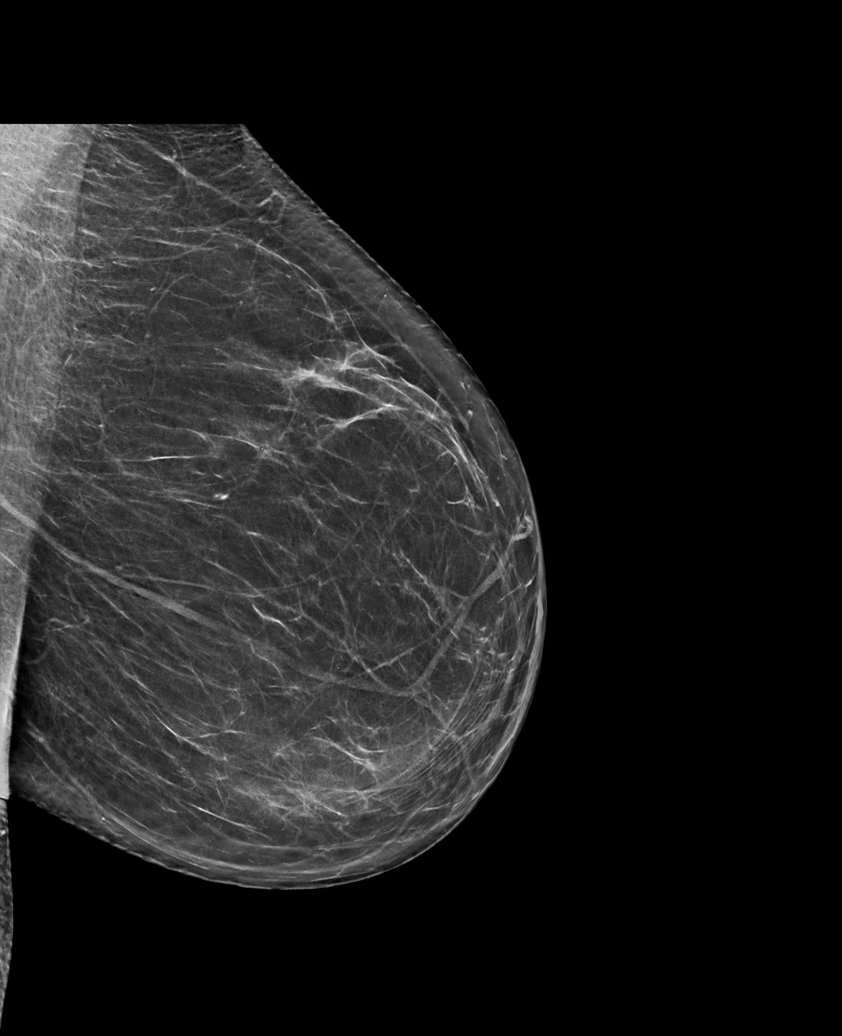

[R MLO synth-2D]
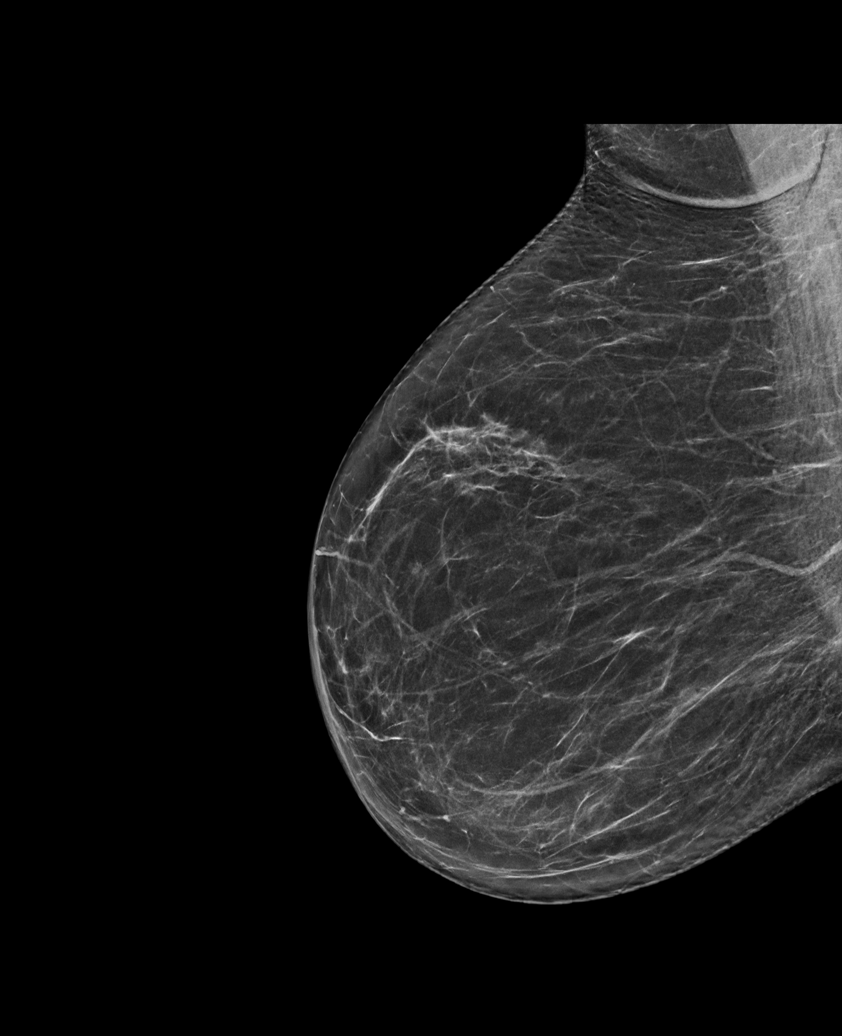

[R MLO tomo · tomo slice 35/70.0]
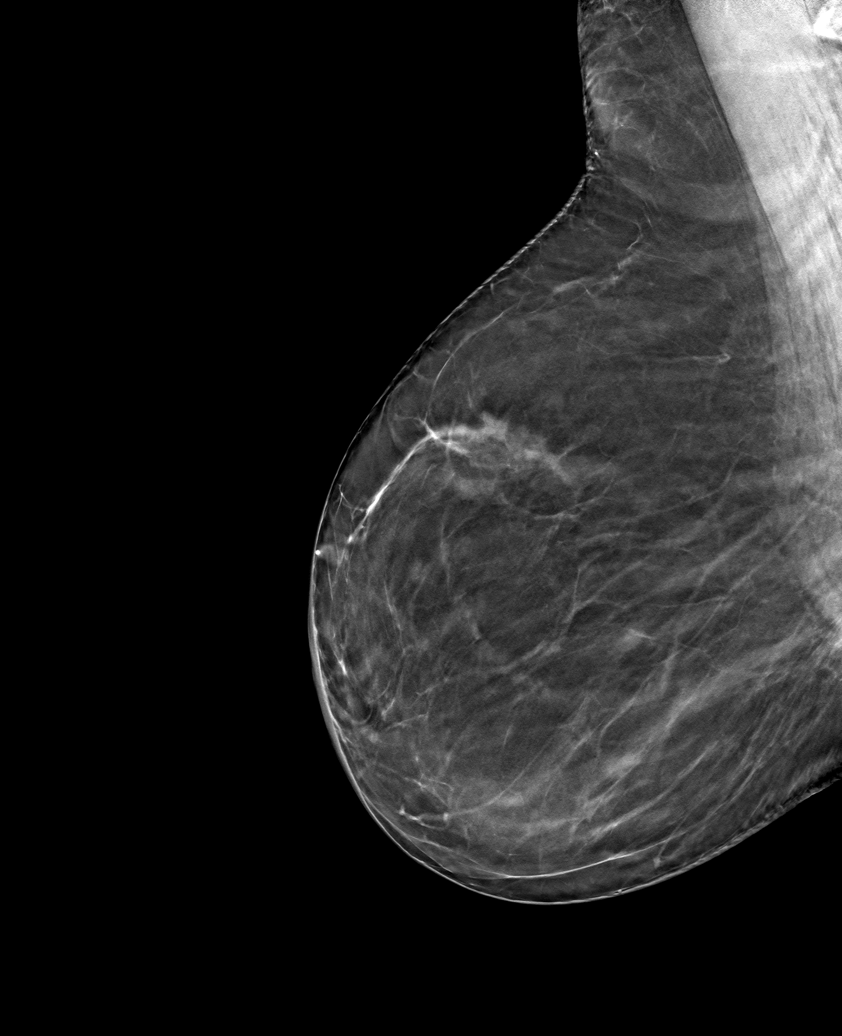

[6 of 30 positions shown; findings below may reference images not displayed]

ACR Breast Density Category b: There are scattered areas of
fibroglandular density.
FINDINGS: There are no findings suspicious for malignancy. Images were
processed with CAD.
IMPRESSION: No mammographic evidence of malignancy. A result letter of this
screening mammogram will be mailed directly to the patient.

RECOMMENDATION:
Screening mammogram in one year. (Code:CN-U-775)

BI-RADS CATEGORY  1: Negative.

## 2022-12-09 DIAGNOSIS — H35443 Age-related reticular degeneration of retina, bilateral: Secondary | ICD-10-CM | POA: Diagnosis not present

## 2022-12-09 DIAGNOSIS — H2513 Age-related nuclear cataract, bilateral: Secondary | ICD-10-CM | POA: Diagnosis not present

## 2022-12-09 DIAGNOSIS — H524 Presbyopia: Secondary | ICD-10-CM | POA: Diagnosis not present

## 2023-01-15 ENCOUNTER — Other Ambulatory Visit: Payer: Self-pay | Admitting: Family

## 2023-01-15 DIAGNOSIS — Z1231 Encounter for screening mammogram for malignant neoplasm of breast: Secondary | ICD-10-CM

## 2023-02-27 ENCOUNTER — Ambulatory Visit
Admission: RE | Admit: 2023-02-27 | Discharge: 2023-02-27 | Disposition: A | Discharge status: Home / Self Care | Payer: Medicare HMO | Source: Ambulatory Visit | Attending: Family | Admitting: Family

## 2023-02-27 DIAGNOSIS — Z1231 Encounter for screening mammogram for malignant neoplasm of breast: Secondary | ICD-10-CM | POA: Diagnosis not present

## 2023-03-17 ENCOUNTER — Ambulatory Visit (INDEPENDENT_AMBULATORY_CARE_PROVIDER_SITE_OTHER): Payer: Medicare HMO | Admitting: Family

## 2023-03-17 ENCOUNTER — Encounter: Payer: Self-pay | Admitting: Family

## 2023-03-17 VITALS — BP 130/74 | HR 67 | Resp 16 | Ht 67.0 in | Wt 158.4 lb

## 2023-03-17 DIAGNOSIS — Z0000 Encounter for general adult medical examination without abnormal findings: Secondary | ICD-10-CM

## 2023-03-17 DIAGNOSIS — I10 Essential (primary) hypertension: Secondary | ICD-10-CM | POA: Diagnosis not present

## 2023-03-17 DIAGNOSIS — E782 Mixed hyperlipidemia: Secondary | ICD-10-CM | POA: Diagnosis not present

## 2023-03-17 DIAGNOSIS — E559 Vitamin D deficiency, unspecified: Secondary | ICD-10-CM

## 2023-03-17 LAB — CBC WITH DIFFERENTIAL/PLATELET
Basophils Absolute: 0 10*3/uL (ref 0.0–0.1)
Basophils Relative: 0.5 % (ref 0.0–3.0)
Eosinophils Absolute: 0 10*3/uL (ref 0.0–0.7)
Eosinophils Relative: 0.9 % (ref 0.0–5.0)
HCT: 44.8 % (ref 36.0–46.0)
Hemoglobin: 15.1 g/dL — ABNORMAL HIGH (ref 12.0–15.0)
Lymphocytes Relative: 30.6 % (ref 12.0–46.0)
Lymphs Abs: 1.5 10*3/uL (ref 0.7–4.0)
MCHC: 33.7 g/dL (ref 30.0–36.0)
MCV: 98 fL (ref 78.0–100.0)
Monocytes Absolute: 0.6 10*3/uL (ref 0.1–1.0)
Monocytes Relative: 12.1 % — ABNORMAL HIGH (ref 3.0–12.0)
Neutro Abs: 2.8 10*3/uL (ref 1.4–7.7)
Neutrophils Relative %: 55.9 % (ref 43.0–77.0)
Platelets: 310 10*3/uL (ref 150.0–400.0)
RBC: 4.58 Mil/uL (ref 3.87–5.11)
RDW: 12.8 % (ref 11.5–15.5)
WBC: 5 10*3/uL (ref 4.0–10.5)

## 2023-03-17 LAB — VITAMIN D 25 HYDROXY (VIT D DEFICIENCY, FRACTURES): VITD: 47.98 ng/mL (ref 30.00–100.00)

## 2023-03-17 LAB — COMPREHENSIVE METABOLIC PANEL
ALT: 24 U/L (ref 0–35)
AST: 23 U/L (ref 0–37)
Albumin: 4.6 g/dL (ref 3.5–5.2)
Alkaline Phosphatase: 60 U/L (ref 39–117)
BUN: 13 mg/dL (ref 6–23)
CO2: 30 meq/L (ref 19–32)
Calcium: 9.8 mg/dL (ref 8.4–10.5)
Chloride: 101 meq/L (ref 96–112)
Creatinine, Ser: 0.49 mg/dL (ref 0.40–1.20)
GFR: 95.59 mL/min (ref >60.00)
Glucose, Bld: 114 mg/dL — ABNORMAL HIGH (ref 70–99)
Potassium: 4.8 meq/L (ref 3.5–5.1)
Sodium: 140 meq/L (ref 135–145)
Total Bilirubin: 0.8 mg/dL (ref 0.2–1.2)
Total Protein: 6.9 g/dL (ref 6.0–8.3)

## 2023-03-17 LAB — LIPID PANEL
Cholesterol: 191 mg/dL (ref 0–200)
HDL: 74 mg/dL (ref >39.00)
LDL Cholesterol: 98 mg/dL (ref 0–99)
NonHDL: 117.41
Total CHOL/HDL Ratio: 3
Triglycerides: 96 mg/dL (ref 0.0–149.0)
VLDL: 19.2 mg/dL (ref 0.0–40.0)

## 2023-03-17 MED ORDER — ROSUVASTATIN CALCIUM 10 MG PO TABS: 10.0000 mg | ORAL_TABLET | Freq: Every day | ORAL | 3 refills | Status: AC

## 2023-03-17 MED ORDER — VALACYCLOVIR HCL 1 G PO TABS: ORAL_TABLET | ORAL | 1 refills | Status: AC

## 2023-03-17 MED ORDER — AMLODIPINE BESYLATE-VALSARTAN 5-160 MG PO TABS: 1.0000 | ORAL_TABLET | Freq: Every day | ORAL | 3 refills | Status: AC

## 2023-03-17 NOTE — Progress Notes (Signed)
Carmen Cruz is a 72 y.o. female with the following history as recorded in EpicCare:  Patient Active Problem List   Diagnosis Date Noted   Hypertension 03/11/2022   Hyperlipidemia 03/11/2022    Current Outpatient Medications  Medication Sig Dispense Refill   APPLE CIDER VINEGAR PO Take 450 mg by mouth.     calcium carbonate (OS-CAL) 600 MG TABS tablet Take 600 mg by mouth 3 (three) times daily with meals.     Coenzyme Q10 (COQ10) 100 MG CAPS Take by mouth.     glucosamine-chondroitin 500-400 MG tablet Take 1 tablet by mouth 2 (two) times daily.     Multiple Vitamin (MULTIVITAMIN) tablet Take 1 tablet by mouth daily.     NON FORMULARY Fat Burner Green Tea     OVER THE COUNTER MEDICATION Vitamin D 3 one capsule daily.     OVER THE COUNTER MEDICATION Fish Oil, 1200 units three tablets daily.     OVER THE COUNTER MEDICATION Vitamin E 400 units one capsule daily.     OVER THE COUNTER MEDICATION Red Yeast Rice, two capsules once a day.     zinc gluconate 50 MG tablet Take 50 mg by mouth daily.     amLODipine-valsartan (EXFORGE) 5-160 MG tablet Take 1 tablet by mouth daily. 90 tablet 3   rosuvastatin (CRESTOR) 10 MG tablet Take 1 tablet (10 mg total) by mouth daily. 90 tablet 3   valACYclovir (VALTREX) 1000 MG tablet Take 2 tablets po bid x 1 day prn for cold sore 20 tablet 1   No current facility-administered medications for this visit.    Allergies: Patient has no known allergies.  Past Medical History:  Diagnosis Date   Chicken pox    History of colon polyps    Hyperlipidemia    Hypertension     Past Surgical History:  Procedure Laterality Date   TOOTH EXTRACTION     TUBAL LIGATION      Family History  Problem Relation Age of Onset   High Cholesterol Mother    High blood pressure Mother    Stroke Mother    High Cholesterol Father    High blood pressure Father    Breast cancer Paternal Aunt    Colon cancer Neg Hx    Esophageal cancer Neg Hx    Liver cancer Neg Hx     Pancreatic cancer Neg Hx    Rectal cancer Neg Hx    Stomach cancer Neg Hx     Social History   Tobacco Use   Smoking status: Former   Smokeless tobacco: Never   Tobacco comments:    Quit 8 years ago.  Substance Use Topics   Alcohol use: Yes    Comment: socially    Subjective:   Presents for yearly CPE; no acute concerns today; up to date on preventive care needs; defers DEXA today;  Is having some pain in both feet- using OTC inserts with some relief;  Exercises 3-4 x/ week;   Review of Systems  Constitutional: Negative.   HENT: Negative.    Eyes: Negative.   Respiratory: Negative.    Cardiovascular: Negative.   Gastrointestinal: Negative.   Genitourinary: Negative.   Musculoskeletal: Negative.   Skin: Negative.   Neurological: Negative.   Endo/Heme/Allergies: Negative.   Psychiatric/Behavioral: Negative.       Objective:  Vitals:   03/17/23 0823  BP: 130/74  Pulse: 67  Resp: 16  SpO2: 94%  Weight: 158 lb 6.4 oz (71.8 kg)  Height: 5\' 7"  (1.702 m)    General: Well developed, well nourished, in no acute distress  Skin : Warm and dry.  Head: Normocephalic and atraumatic  Eyes: Sclera and conjunctiva clear; pupils round and reactive to light; extraocular movements intact  Ears: External normal; canals clear; tympanic membranes normal  Oropharynx: Pink, supple. No suspicious lesions  Neck: Supple without thyromegaly, adenopathy  Lungs: Respirations unlabored; clear to auscultation bilaterally without wheeze, rales, rhonchi  CVS exam: normal rate and regular rhythm.  Abdomen: Soft; nontender; nondistended; normoactive bowel sounds; no masses or hepatosplenomegaly  Musculoskeletal: No deformities; no active joint inflammation  Extremities: No edema, cyanosis, clubbing  Vessels: Symmetric bilaterally  Neurologic: Alert and oriented; speech intact; face symmetrical; moves all extremities well; CNII-XII intact without focal deficit   Assessment:  1. PE (physical  exam), annual   2. Mixed hyperlipidemia   3. Vitamin D deficiency   4. Primary hypertension     Plan:  Congratulated patient on commitment to her health; Age appropriate preventive healthcare needs addressed; encouraged regular eye doctor and dental exams; encouraged regular exercise; will update labs and refills as needed today; follow-up in 1 year, sooner prn.     No follow-ups on file.  Orders Placed This Encounter  Procedures   CBC with Differential/Platelet   Comp Met (CMET)   Lipid panel   Vitamin D (25 hydroxy)    Requested Prescriptions   Signed Prescriptions Disp Refills   amLODipine-valsartan (EXFORGE) 5-160 MG tablet 90 tablet 3    Sig: Take 1 tablet by mouth daily.   rosuvastatin (CRESTOR) 10 MG tablet 90 tablet 3    Sig: Take 1 tablet (10 mg total) by mouth daily.   valACYclovir (VALTREX) 1000 MG tablet 20 tablet 1    Sig: Take 2 tablets po bid x 1 day prn for cold sore

## 2023-09-15 ENCOUNTER — Ambulatory Visit: Payer: Medicare HMO | Admitting: Family

## 2023-09-22 ENCOUNTER — Ambulatory Visit: Payer: Self-pay | Admitting: Family

## 2023-09-22 ENCOUNTER — Ambulatory Visit (INDEPENDENT_AMBULATORY_CARE_PROVIDER_SITE_OTHER): Payer: Medicare HMO | Admitting: Family

## 2023-09-22 ENCOUNTER — Encounter: Payer: Self-pay | Admitting: Family

## 2023-09-22 ENCOUNTER — Ambulatory Visit (HOSPITAL_BASED_OUTPATIENT_CLINIC_OR_DEPARTMENT_OTHER)
Admission: RE | Admit: 2023-09-22 | Discharge: 2023-09-22 | Disposition: A | Discharge status: Home / Self Care | Source: Ambulatory Visit | Attending: Family | Admitting: Family

## 2023-09-22 VITALS — BP 124/86 | HR 54 | Ht 67.0 in | Wt 155.0 lb

## 2023-09-22 DIAGNOSIS — M545 Low back pain, unspecified: Secondary | ICD-10-CM | POA: Insufficient documentation

## 2023-09-22 DIAGNOSIS — M25552 Pain in left hip: Secondary | ICD-10-CM | POA: Insufficient documentation

## 2023-09-22 DIAGNOSIS — M438X6 Other specified deforming dorsopathies, lumbar region: Secondary | ICD-10-CM | POA: Diagnosis not present

## 2023-09-22 DIAGNOSIS — R7309 Other abnormal glucose: Secondary | ICD-10-CM

## 2023-09-22 DIAGNOSIS — M47816 Spondylosis without myelopathy or radiculopathy, lumbar region: Secondary | ICD-10-CM | POA: Diagnosis not present

## 2023-09-22 LAB — COMPREHENSIVE METABOLIC PANEL WITH GFR
ALT: 23 U/L (ref 0–35)
AST: 21 U/L (ref 0–37)
Albumin: 4.7 g/dL (ref 3.5–5.2)
Alkaline Phosphatase: 51 U/L (ref 39–117)
BUN: 12 mg/dL (ref 6–23)
CO2: 31 meq/L (ref 19–32)
Calcium: 10 mg/dL (ref 8.4–10.5)
Chloride: 101 meq/L (ref 96–112)
Creatinine, Ser: 0.52 mg/dL (ref 0.40–1.20)
GFR: 93.89 mL/min (ref >60.00)
Glucose, Bld: 110 mg/dL — ABNORMAL HIGH (ref 70–99)
Potassium: 4.6 meq/L (ref 3.5–5.1)
Sodium: 139 meq/L (ref 135–145)
Total Bilirubin: 1.2 mg/dL (ref 0.2–1.2)
Total Protein: 7.1 g/dL (ref 6.0–8.3)

## 2023-09-22 LAB — HEMOGLOBIN A1C: Hgb A1c MFr Bld: 5.9 % (ref 4.6–6.5)

## 2023-09-22 NOTE — Progress Notes (Signed)
 Carmen Cruz is a 73 y.o. female with the following history as recorded in [employer]:  Patient Active Problem List   Diagnosis Date Noted   Hypertension 03/11/2022   Hyperlipidemia 03/11/2022    Current Outpatient Medications  Medication Sig Dispense Refill   amLODipine -valsartan  (EXFORGE ) 5-160 MG tablet Take 1 tablet by mouth daily. 90 tablet 3   APPLE CIDER VINEGAR PO Take 450 mg by mouth.     calcium  carbonate (OS-CAL) 600 MG TABS tablet Take 600 mg by mouth 3 (three) times daily with meals.     Coenzyme Q10 (COQ10) 100 MG CAPS Take by mouth.     glucosamine-chondroitin 500-400 MG tablet Take 1 tablet by mouth 2 (two) times daily.     Multiple Vitamin (MULTIVITAMIN) tablet Take 1 tablet by mouth daily.     NON FORMULARY Fat Burner Green Tea     OVER THE COUNTER MEDICATION Vitamin D  3 one capsule daily.     OVER THE COUNTER MEDICATION Fish Oil, 1200 units three tablets daily.     OVER THE COUNTER MEDICATION Vitamin E 400 units one capsule daily.     OVER THE COUNTER MEDICATION Red Yeast Rice, two capsules once a day.     rosuvastatin  (CRESTOR ) 10 MG tablet Take 1 tablet (10 mg total) by mouth daily. 90 tablet 3   valACYclovir  (VALTREX ) 1000 MG tablet Take 2 tablets po bid x 1 day prn for cold sore 20 tablet 1   zinc gluconate 50 MG tablet Take 50 mg by mouth daily.     No current facility-administered medications for this visit.    Allergies: Patient has no known allergies.  Past Medical History:  Diagnosis Date   Chicken pox    History of colon polyps    Hyperlipidemia    Hypertension     Past Surgical History:  Procedure Laterality Date   TOOTH EXTRACTION     TUBAL LIGATION      Family History  Problem Relation Age of Onset   High Cholesterol Mother    High blood pressure Mother    Stroke Mother    High Cholesterol Father    High blood pressure Father    Breast cancer Paternal Aunt    Colon cancer Neg Hx    Esophageal cancer Neg Hx    Liver cancer Neg Hx     Pancreatic cancer Neg Hx    Rectal cancer Neg Hx    Stomach cancer Neg Hx     Social History   Tobacco Use   Smoking status: Former   Smokeless tobacco: Never   Tobacco comments:    Quit 8 years ago.  Substance Use Topics   Alcohol use: Yes    Comment: socially    Subjective:   Follow up to re-check glucose/ history of pre-diabetes;  Also notes has been having increased pain in lower back/ left hip; is exercising daily on treadmill/ admits not stretching regularly like she was; no radiating symptoms into lower extremities;   Objective:  Vitals:   09/22/23 0858  BP: 124/86  Pulse: (!) 54  SpO2: 95%  Weight: 155 lb (70.3 kg)  Height: 5' 7 (1.702 m)    General: Well developed, well nourished, in no acute distress  Skin : Warm and dry.  Head: Normocephalic and atraumatic  Lungs: Respirations unlabored; clear to auscultation bilaterally without wheeze, rales, rhonchi  CVS exam: normal rate and regular rhythm.  Musculoskeletal: No deformities; no active joint inflammation  Extremities: No edema,  cyanosis, clubbing  Vessels: Symmetric bilaterally  Neurologic: Alert and oriented; speech intact; face symmetrical; moves all extremities well; CNII-XII intact without focal deficit   Assessment:  1. Elevated glucose   2. Low back pain without sciatica, unspecified back pain laterality, unspecified chronicity   3. Pain of left hip     Plan:   Re-check CMP, Hgba1c today; continue to work on healthy diet/ limit intake of refined sugars;  Update X-ray of left hip and lumbar spine; encouraged to re-start her stretching routine; to consider PT as an option as well; patient defers medication at this time.  No follow-ups on file.  Orders Placed This Encounter  Procedures   DG Lumbar Spine Complete    Standing Status:   Future    Expiration Date:   09/21/2024    Reason for Exam (SYMPTOM  OR DIAGNOSIS REQUIRED):   low back pain    Preferred imaging location?:   [facility]    DG HIP UNILAT W OR W/O PELVIS 2-3 VIEWS LEFT    Standing Status:   Future    Expiration Date:   03/23/2024    Reason for Exam (SYMPTOM  OR DIAGNOSIS REQUIRED):   hip pain    Preferred imaging location?:   [facility]   Comp Met (CMET)   Hemoglobin A1c    Requested Prescriptions    No prescriptions requested or ordered in this encounter

## 2023-12-14 DIAGNOSIS — H35443 Age-related reticular degeneration of retina, bilateral: Secondary | ICD-10-CM | POA: Diagnosis not present

## 2023-12-14 DIAGNOSIS — H524 Presbyopia: Secondary | ICD-10-CM | POA: Diagnosis not present

## 2023-12-14 DIAGNOSIS — H2513 Age-related nuclear cataract, bilateral: Secondary | ICD-10-CM | POA: Diagnosis not present

## 2024-02-08 ENCOUNTER — Other Ambulatory Visit: Payer: Self-pay | Admitting: Family Medicine

## 2024-02-08 DIAGNOSIS — Z1231 Encounter for screening mammogram for malignant neoplasm of breast: Secondary | ICD-10-CM

## 2024-03-18 ENCOUNTER — Encounter: Payer: Medicare HMO | Admitting: Family

## 2024-03-22 ENCOUNTER — Ambulatory Visit: Admitting: Family

## 2024-06-06 ENCOUNTER — Ambulatory Visit

## 2024-06-14 ENCOUNTER — Encounter: Admitting: Family Medicine
